# Patient Record
Sex: Female | Born: 1970 | Race: Black or African American | Hispanic: No | Marital: Single | Smoking: Never smoker
Health system: Southern US, Community
[De-identification: ages and names within clinical notes are randomized; demographics above are authoritative.]

## PROBLEM LIST (undated history)

## (undated) DIAGNOSIS — IMO0002 Reserved for concepts with insufficient information to code with codable children: Secondary | ICD-10-CM

## (undated) DIAGNOSIS — M329 Systemic lupus erythematosus, unspecified: Secondary | ICD-10-CM

## (undated) DIAGNOSIS — K219 Gastro-esophageal reflux disease without esophagitis: Secondary | ICD-10-CM

## (undated) DIAGNOSIS — R519 Headache, unspecified: Secondary | ICD-10-CM

## (undated) DIAGNOSIS — T7840XA Allergy, unspecified, initial encounter: Secondary | ICD-10-CM

## (undated) DIAGNOSIS — Z8619 Personal history of other infectious and parasitic diseases: Secondary | ICD-10-CM

## (undated) DIAGNOSIS — R51 Headache: Secondary | ICD-10-CM

## (undated) HISTORY — DX: Gastro-esophageal reflux disease without esophagitis: K21.9

## (undated) HISTORY — DX: Reserved for concepts with insufficient information to code with codable children: IMO0002

## (undated) HISTORY — DX: Headache: R51

## (undated) HISTORY — DX: Systemic lupus erythematosus, unspecified: M32.9

## (undated) HISTORY — DX: Personal history of other infectious and parasitic diseases: Z86.19

## (undated) HISTORY — DX: Headache, unspecified: R51.9

## (undated) HISTORY — DX: Allergy, unspecified, initial encounter: T78.40XA

---

## 1999-11-05 ENCOUNTER — Other Ambulatory Visit: Admission: RE | Admit: 1999-11-05 | Discharge: 1999-11-05 | Payer: Self-pay | Admitting: Obstetrics and Gynecology

## 2017-09-06 ENCOUNTER — Encounter: Payer: Self-pay | Admitting: Family Medicine

## 2017-09-06 ENCOUNTER — Ambulatory Visit: Payer: Self-pay | Admitting: Nurse Practitioner

## 2017-09-06 ENCOUNTER — Ambulatory Visit: Payer: BLUE CROSS/BLUE SHIELD | Admitting: Family Medicine

## 2017-09-06 DIAGNOSIS — Z7689 Persons encountering health services in other specified circumstances: Secondary | ICD-10-CM | POA: Diagnosis not present

## 2017-09-06 NOTE — Assessment & Plan Note (Signed)
>  25 minutes spent in face to face time with patient, >50% spent in counselling or coordination of care and reviewing medical history. Awaiting records. The patient indicates understanding of these issues and agrees with the plan.

## 2017-09-06 NOTE — Progress Notes (Signed)
Subjective:   Patient ID: Tanya [name], female    DOB: Aug 12, 1971, 47 y.o.   MRN: [id number]  Tanya [name] is a pleasant 47 y.o. year old female who presents to clinic today with New Patient (Initial Visit) (Patient is here today as a new patient. She is not currently fasting.  [facility]  [facility] was her last PCP and they kept changing providers.) and Lupus (Patient was told that she has Lupus when she was at [facility].  She wants to be sure that this is true.  She signed a release form for those records)  on 09/06/2017  HPI:  Here to establish care. No complaints today.  Was told she may possibly have lupus due to a persistent rash she had last year- had blood work done, was referred to rheum.  Per pt, was told no treatment indicated at this time.  No myalgias.  No family history of autoimmune disease. Current Outpatient Medications on File Prior to Visit  Medication Sig Dispense Refill  . Ascorbic Acid (VITAMIN C) 100 MG tablet Take 100 mg by mouth daily.    Marland Kitchen. BLACK COHOSH PO Take by mouth.    . cholecalciferol (VITAMIN D) 1000 units tablet Take by mouth.    . levonorgestrel (MIRENA) 20 MCG/24HR IUD 1 each by Intrauterine route once.    . Multiple Vitamin (MULTIVITAMIN) tablet Take 1 tablet by mouth daily.    . Probiotic Product (SOLUBLE FIBER/PROBIOTICS PO) Take by mouth.    . vitamin B-12 (CYANOCOBALAMIN) 1000 MCG tablet Take 1,000 mcg by mouth daily.     No current facility-administered medications on file prior to visit.     Allergies not on file  Past Medical History:  Diagnosis Date  . Allergy   . Frequent headaches   . GERD (gastroesophageal reflux disease)   . History of chicken pox   . Lupus       Family History  Problem Relation Age of Onset  . Early death Mother   . Cirrhosis Father   . Alcohol abuse Father   . Cancer Daughter     Social History   Socioeconomic History  . Marital status: Unknown   Spouse name: Not on file  . Number of children: Not on file  . Years of education: Not on file  . Highest education level: Not on file  Social Needs  . Financial resource strain: Not on file  . Food insecurity - worry: Not on file  . Food insecurity - inability: Not on file  . Transportation needs - medical: Not on file  . Transportation needs - non-medical: Not on file  Occupational History  . Not on file  Tobacco Use  . Smoking status: Never Smoker  . Smokeless tobacco: Never Used  Substance and Sexual Activity  . Alcohol use: No    Frequency: Never  . Drug use: No  . Sexual activity: Yes    Partners: Male  Other Topics Concern  . Not on file  Social History Narrative  . Not on file   The PMH, PSH, Social History, Family History, Medications, and allergies [facility], and have been updated if relevant.   Review of Systems  Cardiovascular: Negative.   Gastrointestinal: Negative.   Musculoskeletal: Negative.   Skin: Positive for rash.  All other systems reviewed and are negative.      Objective:    BP 106/76 (BP Location: Left Arm, Patient  Position: Sitting, Cuff Size: Normal)   Pulse 88   Temp 97.7 F (36.5 C) (Oral)   Ht 5\' 4"  (1.626 m)   Wt 164 lb (74.4 kg)   LMP 09/06/2016   SpO2 99%   BMI 28.15 kg/m    Physical Exam    General:  Well-developed,well-nourished,in no acute distress; alert,appropriate and cooperative throughout examination Head:  normocephalic and atraumatic.   Eyes:  vision grossly intact, PERRL Ears:  R ear normal and L ear normal externally, TMs clear bilaterally Nose:  no external deformity.   Mouth:  good dentition.   Neck:  No deformities, masses, or tenderness noted.  Lungs:  Normal respiratory effort, chest expands symmetrically. Lungs are clear to auscultation, no crackles or wheezes. Heart:  Normal rate and regular rhythm. S1 and S2 normal without gallop, murmur, click, rub or other extra sounds. Msk:  No  deformity or scoliosis noted of thoracic or lumbar spine.   Extremities:  No clubbing, cyanosis, edema, or deformity noted with normal full range of motion of all joints.   Neurologic:  alert & oriented X3 and gait normal.   Skin:  Intact without suspicious lesions or rashes Psych:  Cognition and judgment appear intact. Alert and cooperative with normal attention span and concentration. No apparent delusions, illusions, hallucinations      Assessment & Plan:   Establishing care with new doctor, encounter for No Follow-up on file.

## 2017-09-06 NOTE — Patient Instructions (Signed)
It was so great to meet you.  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

## 2017-09-20 ENCOUNTER — Encounter: Payer: Self-pay | Admitting: Family Medicine

## 2017-09-25 ENCOUNTER — Ambulatory Visit: Payer: BLUE CROSS/BLUE SHIELD | Admitting: Family Medicine

## 2017-09-25 ENCOUNTER — Ambulatory Visit (INDEPENDENT_AMBULATORY_CARE_PROVIDER_SITE_OTHER): Payer: BLUE CROSS/BLUE SHIELD

## 2017-09-25 ENCOUNTER — Encounter: Payer: Self-pay | Admitting: Family Medicine

## 2017-09-25 VITALS — BP 118/68 | HR 65 | Temp 98.0°F | Ht 64.0 in | Wt 165.0 lb

## 2017-09-25 DIAGNOSIS — M545 Low back pain, unspecified: Secondary | ICD-10-CM | POA: Insufficient documentation

## 2017-09-25 MED ORDER — PREDNISONE 5 MG PO TABS: ORAL_TABLET | ORAL | 0 refills | Status: DC

## 2017-09-25 NOTE — Assessment & Plan Note (Signed)
Pain seems to be related to SI joint with less movement on the left.  - xrays of SI joint and lumbar spine  - prednisone  - counseled on HEP  - f/u in 4-6 weeks, if no improvement consider Si joint injection

## 2017-09-25 NOTE — Patient Instructions (Signed)
We will call you with the results from today   Please try the exercises   Please follow up with me in 4-6 weeks if you don't have any improvement.

## 2017-09-25 NOTE — Progress Notes (Signed)
Tanya [name] - 47 y.o. female MRN [id number]  Date of birth: 1971/06/13  SUBJECTIVE:  Including CC & ROS.  Chief Complaint  Patient presents with  . Back and hip pain    Tanya GoldVivian A [name] is a 47 y.o. female that is presenting with low back pain. Pain has been increasing over the past two months. Located near her tailbone and radiates downward to the posterior aspect of greater trochanteric area, Pain is described as a constant burning. Worse when sitting or standing for long period. She has been using a heating pad, aleve and applying Aspercreme with no improvement. Pain is worse at night, unable to get in a comfortable position. Denies any prior symptoms, injury or surgery. Pain is getting worse.   Review of Systems  Constitutional: Negative for fever.  HENT: Negative for ear pain.   Respiratory: Negative for shortness of breath.   Cardiovascular: Negative for chest pain.  Gastrointestinal: Negative for abdominal pain.  Musculoskeletal: Positive for back pain. Negative for gait problem.  Skin: Negative for color change.  Neurological: Negative for weakness.  Hematological: Negative for adenopathy.  Psychiatric/Behavioral: Negative for agitation.    HISTORY: Past Medical, Surgical, Social, and Family History Reviewed & Updated per EMR.   Pertinent Historical Findings include:  Past Medical History:  Diagnosis Date  . Allergy   . Frequent headaches   . GERD (gastroesophageal reflux disease)   . History of chicken pox   . Lupus     Past Surgical History:  Procedure Laterality Date  . CESAREAN SECTION  2003    Not on File  Family History  Problem Relation Age of Onset  . Early death Mother   . Cirrhosis Father   . Alcohol abuse Father   . Cancer Daughter      Social History   Socioeconomic History  . Marital status: Single    Spouse name: Not on file  . Number of children: Not on file  . Years of education: Not on file  . Highest education level: Not on file   Social Needs  . Financial resource strain: Not on file  . Food insecurity - worry: Not on file  . Food insecurity - inability: Not on file  . Transportation needs - medical: Not on file  . Transportation needs - non-medical: Not on file  Occupational History  . Not on file  Tobacco Use  . Smoking status: Never Smoker  . Smokeless tobacco: Never Used  Substance and Sexual Activity  . Alcohol use: No    Frequency: Never  . Drug use: No  . Sexual activity: Yes    Partners: Male  Other Topics Concern  . Not on file  Social History Narrative  . Not on file     PHYSICAL EXAM:  VS: BP 118/68 (BP Location: Left Arm, Patient Position: Sitting, Cuff Size: Normal)   Pulse 65   Temp 98 F (36.7 C) (Oral)   Ht 5\' 4"  (1.626 m)   Wt 165 lb (74.8 kg)   SpO2 98%   BMI 28.32 kg/m  Physical Exam Gen: NAD, alert, cooperative with exam, well-appearing ENT: normal lips, normal nasal mucosa,  Eye: normal EOM, normal conjunctiva and lids CV:  no edema, +2 pedal pulses   Resp: no accessory muscle use, non-labored,  Skin: no rashes, no areas of induration  Neuro: normal tone, normal sensation to touch Psych:  normal insight, alert and oriented MSK:  Back:  TTP of the SI joint  No TTP  of the GT  Normal IR and ER of the hips  Limitation with FABER on left, more normal on right  Normal FADIR  Negative SLR b/l  Neurovascularly intact      ASSESSMENT & PLAN:   Acute bilateral low back pain without sciatica Pain seems to be related to SI joint with less movement on the left.  - xrays of SI joint and lumbar spine  - prednisone  - counseled on HEP  - f/u in 4-6 weeks, if no improvement consider Si joint injection

## 2017-09-26 ENCOUNTER — Ambulatory Visit: Payer: BLUE CROSS/BLUE SHIELD | Admitting: Family Medicine

## 2017-10-01 ENCOUNTER — Encounter: Payer: Self-pay | Admitting: Family Medicine

## 2017-10-02 MED ORDER — CYCLOBENZAPRINE HCL 10 MG PO TABS: 10.0000 mg | ORAL_TABLET | Freq: Three times a day (TID) | ORAL | 0 refills | Status: AC | PRN

## 2017-10-02 MED ORDER — IBUPROFEN 800 MG PO TABS: 800.0000 mg | ORAL_TABLET | Freq: Three times a day (TID) | ORAL | 1 refills | Status: AC | PRN

## 2018-03-12 ENCOUNTER — Encounter: Payer: Self-pay | Admitting: Family Medicine

## 2018-03-13 ENCOUNTER — Other Ambulatory Visit: Payer: Self-pay | Admitting: Family Medicine

## 2018-03-13 ENCOUNTER — Ambulatory Visit: Payer: BLUE CROSS/BLUE SHIELD | Admitting: Family Medicine

## 2018-03-13 ENCOUNTER — Other Ambulatory Visit (HOSPITAL_COMMUNITY)
Admission: RE | Admit: 2018-03-13 | Discharge: 2018-03-13 | Disposition: A | Discharge status: Home / Self Care | Payer: BLUE CROSS/BLUE SHIELD | Source: Ambulatory Visit | Attending: Family Medicine | Admitting: Family Medicine

## 2018-03-13 ENCOUNTER — Encounter: Payer: Self-pay | Admitting: Family Medicine

## 2018-03-13 VITALS — BP 112/70 | HR 87 | Temp 98.2°F | Ht 64.0 in | Wt 164.0 lb

## 2018-03-13 DIAGNOSIS — N898 Other specified noninflammatory disorders of vagina: Secondary | ICD-10-CM | POA: Insufficient documentation

## 2018-03-13 DIAGNOSIS — M545 Low back pain, unspecified: Secondary | ICD-10-CM

## 2018-03-13 NOTE — Progress Notes (Signed)
SUBJECTIVE:  47 y.o. female complains of copious, milky and thick vaginal discharge for 1 week(s). Denies abnormal vaginal bleeding or significant pelvic pain or fever. No UTI symptoms. Denies history of known exposure to STD.  Used OTC topical monistat for 3 days which helped some but still feels she has malodorous discharge.  Itching has improved some. No LMP recorded. (Menstrual status: IUD).  Current Outpatient Medications on File Prior to Visit  Medication Sig Dispense Refill  . Ascorbic Acid (VITAMIN C) 100 MG tablet Take 100 mg by mouth daily.    Marland Kitchen BLACK COHOSH PO Take by mouth.    . cholecalciferol (VITAMIN D) 1000 units tablet Take by mouth.    . cyclobenzaprine (FLEXERIL) 10 MG tablet Take 1 tablet (10 mg total) by mouth 3 (three) times daily as needed for muscle spasms. 30 tablet 0  . ibuprofen (ADVIL,MOTRIN) 800 MG tablet Take 1 tablet (800 mg total) by mouth every 8 (eight) hours as needed. 60 tablet 1  . levonorgestrel (MIRENA) 20 MCG/24HR IUD 1 each by Intrauterine route once.    . Multiple Vitamin (MULTIVITAMIN) tablet Take 1 tablet by mouth daily.    . predniSONE (DELTASONE) 5 MG tablet Take 6 pills for first day, 5 pills second day, 4 pills third day, 3 pills fourth day, 2 pills the fifth day, and 1 pill sixth day. 21 tablet 0  . Probiotic Product (SOLUBLE FIBER/PROBIOTICS PO) Take by mouth.    . vitamin B-12 (CYANOCOBALAMIN) 1000 MCG tablet Take 1,000 mcg by mouth daily.     No current facility-administered medications on file prior to visit.     Not on File  Past Medical History:  Diagnosis Date  . Allergy   . Frequent headaches   . GERD (gastroesophageal reflux disease)   . History of chicken pox   . Lupus [facility])     Past Surgical History:  Procedure Laterality Date  . CESAREAN SECTION  2003    Family History  Problem Relation Age of Onset  . Early death Mother   . Cirrhosis Father   . Alcohol abuse Father   . Cancer Daughter     Social History    Socioeconomic History  . Marital status: Single    Spouse name: Not on file  . Number of children: Not on file  . Years of education: Not on file  . Highest education level: Not on file  Occupational History  . Not on file  Social Needs  . Financial resource strain: Not on file  . Food insecurity:    Worry: Not on file    Inability: Not on file  . Transportation needs:    Medical: Not on file    Non-medical: Not on file  Tobacco Use  . Smoking status: Never Smoker  . Smokeless tobacco: Never Used  Substance and Sexual Activity  . Alcohol use: No    Frequency: Never  . Drug use: No  . Sexual activity: Yes    Partners: Male  Lifestyle  . Physical activity:    Days per week: Not on file    Minutes per session: Not on file  . Stress: Not on file  Relationships  . Social connections:    Talks on phone: Not on file    Gets together: Not on file    Attends religious service: Not on file    Active member of club or organization: Not on file    Attends meetings of clubs or organizations: Not on file  Relationship status: Not on file  . Intimate partner violence:    Fear of current or ex partner: Not on file    Emotionally abused: Not on file    Physically abused: Not on file    Forced sexual activity: Not on file  Other Topics Concern  . Not on file  Social History Narrative  . Not on file   The PMH, PSH, Social History, Family History, Medications, and allergies [facility], and have been updated if relevant.  OBJECTIVE:  BP 112/70   Pulse 87   Temp 98.2 F (36.8 C) (Oral)   Ht 5\' 4"  (1.626 m)   Wt 164 lb (74.4 kg)   SpO2 98%   BMI 28.15 kg/m   She appears well, afebrile. Abdomen: benign, soft, nontender, no masses. Pelvic Exam: normal external genitalia, vulva, vagina, cervix, uterus and adnexa with thick discharge in cerivcal os.   ASSESSMENT:  no pathogens identified causing these symptoms  PLAN:  GC and chlamydia DNA  probe sent to  lab. Treatment: abstain from coitus during course of treatment and wet prep sent. ROV prn if symptoms persist or worsen.

## 2018-03-13 NOTE — Patient Instructions (Addendum)
Great to see you. I will call you with your results from today and you can view them online.   Please make an appointment with Dr. Jordan LikesSchmitz on your way out.

## 2018-03-13 NOTE — Progress Notes (Signed)
Tanya [name] - 47 y.o. [name] MRN [id number]  Date of birth: 1971-06-04  SUBJECTIVE:  Including CC & ROS.  Chief Complaint  Patient presents with  . Back Pain    SI joint injection, left    Tanya [name] is a 47 y.o. [name] that is presenting with left hip and buttock pain and lower back pain.  She was seen in February and thought to have more of an SI joint problem at that time.  Since that time the pain is occurring in her left buttock as well as the lateral aspect of her left hip in the posterior aspect of her leg.  The pain is been ongoing and becoming more severe.  She has to sit sideways in the car because sitting for prolonged periods of time makes the pain worse.  She has tried medicines with minimal improvement.  She is try to do the exercises and stretching with minimal improvement.  She denies any saddle anesthesia or urinary incontinence.  She denies any specific inciting event..  Independent review of the sacrum from 2/4 shows a normal appearance.  Independent review of the lumbar x-ray from 2/4 shows mild degenerative changes in the lumbar spine.   Review of Systems  Constitutional: Negative for fever.  HENT: Negative for congestion.   Respiratory: Negative for cough.   Cardiovascular: Negative for chest pain.  Gastrointestinal: Negative for abdominal pain.  Musculoskeletal: Negative for gait problem.  Skin: Negative for color change.  Neurological: Negative for weakness.  Hematological: Negative for adenopathy.  Psychiatric/Behavioral: Negative for agitation.    HISTORY: Past Medical, Surgical, Social, and Family History Reviewed & Updated per EMR.   Pertinent Historical Findings include:  Past Medical History:  Diagnosis Date  . Allergy   . Frequent headaches   . GERD (gastroesophageal reflux disease)   . History of chicken pox   . Lupus [facility])     Past Surgical History:  Procedure Laterality Date  . CESAREAN SECTION  2003    Not on File  Family  History  Problem Relation Age of Onset  . Early death Mother   . Cirrhosis Father   . Alcohol abuse Father   . Cancer Daughter      Social History   Socioeconomic History  . Marital status: Single    Spouse name: Not on file  . Number of children: Not on file  . Years of education: Not on file  . Highest education level: Not on file  Occupational History  . Not on file  Social Needs  . Financial resource strain: Not on file  . Food insecurity:    Worry: Not on file    Inability: Not on file  . Transportation needs:    Medical: Not on file    Non-medical: Not on file  Tobacco Use  . Smoking status: Never Smoker  . Smokeless tobacco: Never Used  Substance and Sexual Activity  . Alcohol use: No    Frequency: Never  . Drug use: No  . Sexual activity: Yes    Partners: Male  Lifestyle  . Physical activity:    Days per week: Not on file    Minutes per session: Not on file  . Stress: Not on file  Relationships  . Social connections:    Talks on phone: Not on file    Gets together: Not on file    Attends religious service: Not on file    Active member of club or organization: Not on file  Attends meetings of clubs or organizations: Not on file    Relationship status: Not on file  . Intimate partner violence:    Fear of current or ex partner: Not on file    Emotionally abused: Not on file    Physically abused: Not on file    Forced sexual activity: Not on file  Other Topics Concern  . Not on file  Social History Narrative  . Not on file     PHYSICAL EXAM:  VS: BP 116/74   Ht 5\' 5"  (1.651 m)   Wt 166 lb (75.3 kg)   BMI 27.62 kg/m  Physical Exam Gen: NAD, alert, cooperative with exam, well-appearing ENT: normal lips, normal nasal mucosa,  Eye: normal EOM, normal conjunctiva and lids CV:  no edema, +2 pedal pulses   Resp: no accessory muscle use, non-labored,  Skin: no rashes, no areas of induration  Neuro: normal tone, normal sensation to touch Psych:   normal insight, alert and oriented MSK:  Back /left hip: Tenderness to palpation over the left greater trochanter Mild tenderness over the left SI joint. Normal strength resistance with hip flexion, knee flexion extension, plantarflexion and dorsiflexion. Negative straight leg raise bilaterally. Normal gait Neurovascularly intact   Aspiration/Injection Procedure Note [name] [birth year]  Procedure: Injection Indications: Left hip pain  Procedure Details Consent: Risks of procedure as well as the alternatives and risks of each were explained to the (patient/caregiver).  Consent for procedure obtained. Time Out: Verified patient identification, verified procedure, site/side was marked, verified correct patient position, special equipment/implants available, medications/allergies/relevent history reviewed, required imaging and test results available.  Performed.  The area was cleaned with iodine and alcohol swabs.    The left greater trochanter bursa was injected using 1 cc's of 40 mg Depomedrol and 4 cc's of 0.25% bupivacaine with a 22 3 1/2" needle.  Ultrasound was used. Images were obtained in Transverse views showing the injection.    A sterile dressing was applied.  Patient did tolerate procedure well.    ASSESSMENT & PLAN:   Left hip pain The pain today seems more related to the greater trochanter but could also be representative of sciatica.  Could also be referred from the SI joint but less likely given the tenderness over the area today. -Greater trochanter injection today -Initiate gabapentin -If no improvement would consider an SI joint injection at follow-up in an MRI of the lumbar spine to evaluate for nerve impingement. Consider PT.

## 2018-03-13 NOTE — Addendum Note (Signed)
Addended by: Harold BarbanBYRD, RONECIA E on: 03/13/2018 12:07 PM   Modules accepted: Orders

## 2018-03-14 ENCOUNTER — Encounter: Payer: Self-pay | Admitting: Family Medicine

## 2018-03-14 ENCOUNTER — Ambulatory Visit: Payer: BLUE CROSS/BLUE SHIELD | Admitting: Family Medicine

## 2018-03-14 ENCOUNTER — Ambulatory Visit (INDEPENDENT_AMBULATORY_CARE_PROVIDER_SITE_OTHER): Payer: BLUE CROSS/BLUE SHIELD

## 2018-03-14 VITALS — BP 116/74 | Ht 65.0 in | Wt 166.0 lb

## 2018-03-14 DIAGNOSIS — M25552 Pain in left hip: Secondary | ICD-10-CM

## 2018-03-14 LAB — CERVICOVAGINAL ANCILLARY ONLY
Bacterial vaginitis: NEGATIVE
CANDIDA VAGINITIS: NEGATIVE
CHLAMYDIA, DNA PROBE: NEGATIVE
NEISSERIA GONORRHEA: NEGATIVE
TRICH (WINDOWPATH): NEGATIVE

## 2018-03-14 MED ORDER — GABAPENTIN 100 MG PO CAPS: 100.0000 mg | ORAL_CAPSULE | Freq: Three times a day (TID) | ORAL | 3 refills | Status: AC

## 2018-03-14 NOTE — Patient Instructions (Signed)
Good to see you  Please try the gabapentin. This can make you sleepy. Please start with one at night and you can increase to two times or three times daily as your tolerate.  Please try physical therapy  Please see me back in 4 weeks

## 2018-03-14 NOTE — Assessment & Plan Note (Signed)
The pain today seems more related to the greater trochanter but could also be representative of sciatica.  Could also be referred from the SI joint but less likely given the tenderness over the area today. -Greater trochanter injection today -Initiate gabapentin -If no improvement would consider an SI joint injection at follow-up in an MRI of the lumbar spine to evaluate for nerve impingement. Consider PT.

## 2018-03-22 ENCOUNTER — Ambulatory Visit: Payer: BLUE CROSS/BLUE SHIELD | Attending: Family Medicine | Admitting: Physical Therapy

## 2018-03-22 ENCOUNTER — Encounter: Payer: Self-pay | Admitting: Physical Therapy

## 2018-03-22 ENCOUNTER — Other Ambulatory Visit: Payer: Self-pay

## 2018-03-22 DIAGNOSIS — R29898 Other symptoms and signs involving the musculoskeletal system: Secondary | ICD-10-CM | POA: Insufficient documentation

## 2018-03-22 DIAGNOSIS — M25552 Pain in left hip: Secondary | ICD-10-CM | POA: Insufficient documentation

## 2018-03-22 DIAGNOSIS — M25551 Pain in right hip: Secondary | ICD-10-CM | POA: Diagnosis not present

## 2018-03-22 DIAGNOSIS — R252 Cramp and spasm: Secondary | ICD-10-CM

## 2018-03-22 NOTE — Therapy (Addendum)
[facility] Outpatient Rehabilitation [facility] [address]  [address] [place], [place], [address] Phone: [phone]   Fax:  [phone]  Physical Therapy Evaluation  Patient Details  Name: Tanya [name] MRN: [id number] Date of Birth: [birth year] Referring Provider: Ruthe Mannan, MD   Encounter Date: 03/22/2018  PT End of Session - 03/22/18 1155    Visit Number  1    Number of Visits  10    Authorization Type  BCBS    Authorization - Number of Visits  30    PT Start Time  1017    PT Stop Time  1103    PT Time Calculation (min)  46 min    Activity Tolerance  Patient tolerated treatment well    Behavior During Therapy  [facility] for tasks assessed/performed       Past Medical History:  Diagnosis Date  . Allergy   . Frequent headaches   . GERD (gastroesophageal reflux disease)   . History of chicken pox   . Lupus [facility])     Past Surgical History:  Procedure Laterality Date  . CESAREAN SECTION  2003    There were no vitals filed for this visit.  Subjective Assessment - 03/22/18 1026    Subjective  Pt states that she is having symptoms and pain down into the L buttock and thigh area. Pt has difficulty with laying on her left side sleeping and is worried that laying on her R side all the time will eventually lead to problems with the R. Starting experiencing symptoms down into the L side in the past 3-4 weeks. Pt states that she was given cortisone shot last week with some relief but continues to experience pain/discomfort with sitting and driving for long periods of time.     Limitations  Sitting    How long can you sit comfortably?  45 minutes     Patient Stated Goals  Strengthen the muscles and help to alleviate pain    Currently in Pain?  No/denies    Pain Score  3  7 at worst    Pain Location  Hip    Pain Descriptors / Indicators  Burning;Discomfort    Pain Type  Acute pain    Pain Onset  1 to 4 weeks ago    Pain Frequency  Constant    Aggravating  Factors   Sitting, laying on the L side    Pain Relieving Factors  Medication Lidocaine patch, Cortisone shot     Effect of Pain on Daily Activities  Unable to sit for long periods of time when working and traveling between patients.          [facility] PT Assessment - 03/22/18 0001      Assessment   Medical Diagnosis  Acute bilateral low back pain without sciatica    Referring Provider  Ruthe Mannan, MD    Onset Date/Surgical Date  02/22/18 Approx date of most recent exacerbation    Next MD Visit  04/11/2018 With Clare Gandy, MD    Prior Therapy  No       Balance Screen   Has the patient fallen in the past 6 months  No      Home Environment   Living Environment  Private residence    Living Arrangements  Spouse/significant other    Type of Home  House    Home Access  Level entry      Prior Function   Level of Independence  Independent  Vocation  Full time employment    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 Pt placed feet in full ER during activity.       ROM / Strength   AROM / PROM / Strength  AROM;Strength      AROM   AROM Assessment Site  Lumbar;Hip    Lumbar Flexion  WNL    Lumbar Extension  WNL    Lumbar - Right Side Bend  WNl    Lumbar - Left Side Bend  WNL    Lumbar - Right Rotation  WNL    Lumbar - Left Rotation  WNL      Strength   Strength Assessment Site  Hip;Knee    Right/Left Hip  Right;Left    Right Hip Flexion  4-/5    Right Hip Extension  4+/5    Right Hip External Rotation   4+/5    Right Hip Internal Rotation  4+/5    Right Hip ABduction  5/5    Right Hip ADduction  4/5    Left Hip Flexion  4-/5     Left Hip Extension  4/5    Left Hip External Rotation  4/5    Left Hip Internal Rotation  4/5    Left Hip ABduction  4+/5    Left Hip ADduction  4-/5 + for pain with testing position    Right/Left Knee  Right;Left    Right Knee Flexion  5/5    Right Knee Extension  5/5    Left Knee Flexion  5/5    Left Knee Extension  5/5      Flexibility   Soft Tissue Assessment /Muscle Length  yes    Hamstrings  Mild tightness      Palpation   Spinal mobility  Good joint mobility in lumbar spine, mild restrictions in mobility in lower thoracic levels    Palpation comment  TTP along L greater trochanter. No TTP along low back or distal lateral thigh       Special Tests    Special Tests  Hip Special Tests;Sacrolliac Tests    Other special tests  Thigh Thrust; Positive; Left    Sacroiliac Tests   Pelvic Distraction    Hip Special Tests   Luisa Hart [facility]) Test;Hip Scouring      Pelvic Dictraction   Findings  Negative    Side   Left      Pelvic Compression   Findings  Negative    Side  Left      Sacral thrust    Findings  Negative    Side  Left      Gaenslen's test   Findings  Negative    Side   Left      Luisa Hart (FABER) Test   Findings  Negative    Side  Left    Comments  Pt noted some discomfort in anterior joint line of L hip      Hip Scouring   Findings  Negative    Side  Left  [facility] Adult PT Treatment/Exercise - 03/22/18 0001      Exercises   Exercises  Knee/Hip      Knee/Hip Exercises: Stretches   ITB Stretch  1 rep;30 seconds;Left    ITB Stretch Limitations  Supine with hip flexion and then adduction to adequate stretch ITB. Use of yoga strap to maintain position      Manual Therapy   Manual Therapy  Taping    Kinesiotex  Create Space      Kinesiotix   Create Space  Taping over the length of the L ITB and star pattern over the L greater trochanter. 30% stretch on ITB and 50% on star pattern             PT Education - 03/22/18  1516    Education Details  Pt educated on HEP, POC, and results of examination     Person(s) Educated  Patient    Methods  Explanation;Demonstration;Handout    Comprehension  Verbalized understanding;Returned demonstration         PT Long Term Goals - 03/22/18 1221      PT LONG TERM GOAL #1   Title  Pt will be independent with HEP    Target Date  05/03/18      PT LONG TERM GOAL #2   Title  Pt will report pain no greater than 2/10 at worst to impove tolerance to work related activities including driving long distances    Status  New    Target Date  05/03/18      PT LONG TERM GOAL #3   Title  Pt will have 5/5 global LE strength to improve LE stability and abilty to perform functional activities with decreased pain    Status  New    Target Date  05/03/18      PT LONG TERM GOAL #4   Title  Pt will report no TTP over the L greater trochanter     Status  New    Target Date  05/03/18      PT LONG TERM GOAL #5   Title  Pt will report overall 50% improvement in condition and ability to perform work-related activities without an increase in pain     Status  New    Target Date  05/03/18            Plan - 03/22/18 1156    Clinical Impression Statement  Pt is a 47 y/o F who presents to OP PT with low back pain and L lateral hip pain that is consistent with greater trochanteric bursitis. At this time she is more concerned about her hip pain. She presents with decreased L ER strength, TTP over L greater trochanter and decreased tolerance for sitting activities. These impairments are preventing pt from performing yard work activities and driving for long distances which is required of her at work. Her prognosis is positively impacted by her relatively active lifestyle and the nature of her work, but is negatively impacted by the chronicity of her symptoms. She will benefit from physical therapy to address her pain levels, improve her strength, and progress toward functional goals.      Clinical Presentation  Stable    Clinical Decision Making  Low    Rehab Potential  Good    PT Frequency  2x / week    PT Duration  3 weeks Then 1x week/3 weeks    PT Treatment/Interventions  ADLs/Self Care Home Management;Cryotherapy;Electrical Stimulation;Iontophoresis 4mg /ml Dexamethasone;Moist Heat;Ultrasound;Functional mobility training;Therapeutic activities;Therapeutic exercise;Neuromuscular  re-education;Patient/family education;Manual techniques;Dry needling;Taping;Vasopneumatic Device    PT Next Visit Plan  Assess reponse to taping; review exercises provided by MD    Consulted and Agree with Plan of Care  Patient       Patient will benefit from skilled therapeutic intervention in order to improve the following deficits and impairments:  Decreased activity tolerance, Decreased strength, Pain, Increased muscle spasms, Improper body mechanics, Impaired flexibility, Decreased mobility  Visit Diagnosis: Pain in left hip  Other symptoms and signs involving the musculoskeletal system  Cramp and spasm     Problem List Patient Active Problem List   Diagnosis Date Noted  . Left hip pain 03/14/2018  . Vaginal discharge 03/13/2018    Mikal PlaneJenna Amarien Carne, SPT 03/22/2018, 6:13 PM  [facility] [address]  [address] [place], [place], [address] Phone: [phone]   Fax:  [phone]  Name: Tanya [name] MRN: [id number] Date of Birth: 07/09/1971

## 2018-03-27 ENCOUNTER — Encounter: Payer: Self-pay | Admitting: Physical Therapy

## 2018-03-27 ENCOUNTER — Ambulatory Visit: Payer: BLUE CROSS/BLUE SHIELD | Admitting: Physical Therapy

## 2018-03-27 DIAGNOSIS — M25552 Pain in left hip: Secondary | ICD-10-CM

## 2018-03-27 DIAGNOSIS — R29898 Other symptoms and signs involving the musculoskeletal system: Secondary | ICD-10-CM

## 2018-03-27 DIAGNOSIS — R252 Cramp and spasm: Secondary | ICD-10-CM

## 2018-03-27 NOTE — Therapy (Addendum)
[facility] Outpatient Rehabilitation [facility] [address]  [address] [place], [place], [address] Phone: [phone]   Fax:  [phone]  Physical Therapy Treatment  Patient Details  Name: Tanya [name] MRN: [id number] Date of Birth: 27-Jun-1971 Referring Provider: Ruthe Mannan, MD   Encounter Date: 03/27/2018  PT End of Session - 03/27/18 1705    Visit Number  2    Number of Visits  10    Authorization Type  BCBS    Authorization - Number of Visits  30    PT Start Time  1701    PT Stop Time  1744    PT Time Calculation (min)  43 min    Activity Tolerance  Patient tolerated treatment well    Behavior During Therapy  [facility] for tasks assessed/performed       Past Medical History:  Diagnosis Date  . Allergy   . Frequent headaches   . GERD (gastroesophageal reflux disease)   . History of chicken pox   . Lupus [facility])     Past Surgical History:  Procedure Laterality Date  . CESAREAN SECTION  2003    There were no vitals filed for this visit.  Subjective Assessment - 03/27/18 1702    Subjective  Pt reports that she is well today and continues to have difficulty with sleeping on her L side.     How long can you sit comfortably?  45 minutes     Patient Stated Goals  Strengthen the muscles and help to alleviate pain    Currently in Pain?  No/denies    Pain Score  0-No pain                       [facility] Adult PT Treatment/Exercise - 03/27/18 0001      Knee/Hip Exercises: Standing   Forward Lunges  Left;10 reps    Forward Lunges Limitations  L LE anterior; With VC and demonstration for proper form with R hip neutrally aligned and knee at 90 degrees    Forward Step Up  10 reps;Left;Step Height: 8"    Step Down  Left;10 reps;Step Height: 8"    Step Down Limitations  L LE     Functional Squat  Limitations    Functional Squat Limitations  12 reps with good form, VC and TC to prevent anterior translation of the knes over the toes. 2 UE assist  at the counter    Other Standing Knee Exercises  Lateral walking with red TB around forefoot; 15 reps in each direction      Knee/Hip Exercises: Supine   Bridges  15 reps;Both;Limitations    Bridges Limitations  5 reps with no band; 10 reps with red TB around knees      Knee/Hip Exercises: Sidelying   Other Sidelying Knee/Hip Exercises  Side plank from knees; Cueing to prevent hip flexion; 10 reps       Manual Therapy   Manual Therapy  Myofascial release;Taping    Kinesiotex  Inhibit Muscle      Kinesiotix   Create Space  Taping over the length of the L ITB - 30% stretch     Inhibit Muscle   30% stretch - L Glutes and from TFL to iliac crest             PT Education - 03/27/18 1759    Education Details  Provided with updated HEP exercises    Methods  Explanation;Demonstration;Handout  Comprehension  Verbalized understanding;Returned demonstration;Verbal cues required          PT Long Term Goals - 03/27/18 1752      PT LONG TERM GOAL #1   Title  Pt will be independent with HEP    Status  On-going      PT LONG TERM GOAL #2   Title  Pt will report pain no greater than 2/10 at worst to impove tolerance to work related activities including driving long distances    Status  On-going      PT LONG TERM GOAL #3   Title  Pt will have 5/5 global LE strength to improve LE stability and abilty to perform functional activities with decreased pain    Status  On-going      PT LONG TERM GOAL #4   Title  Pt will report no TTP over the L greater trochanter     Status  On-going      PT LONG TERM GOAL #5   Title  Pt will report overall 50% improvement in condition and ability to perform work-related activities without an increase in pain     Status  On-going            Plan - 03/27/18 1752    Clinical Impression Statement  Tanya [name] tolerated treatment well today focusing on strengthening of hip musculature and manual therapy to address increased tension in L ERs. At this  time she continues to demonstrate decreased strength in LE musculature and decreased tolerance to L side lying which is impacting her sleep tolerance. At this time she will continue to benefit from physical therapy to address these deficits and progress toward her functional goals.     Rehab Potential  Good    PT Duration  --    PT Treatment/Interventions  ADLs/Self Care Home Management;Cryotherapy;Electrical Stimulation;Iontophoresis 4mg /ml Dexamethasone;Moist Heat;Ultrasound;Functional mobility training;Therapeutic activities;Therapeutic exercise;Neuromuscular re-education;Patient/family education;Manual techniques;Dry needling;Taping;Vasopneumatic Device    Consulted and Agree with Plan of Care  Patient       Patient will benefit from skilled therapeutic intervention in order to improve the following deficits and impairments:  Decreased activity tolerance, Decreased strength, Pain, Increased muscle spasms, Improper body mechanics, Impaired flexibility, Decreased mobility  Visit Diagnosis: Pain in left hip  Other symptoms and signs involving the musculoskeletal system  Cramp and spasm     Problem List Patient Active Problem List   Diagnosis Date Noted  . Left hip pain 03/14/2018  . Vaginal discharge 03/13/2018    Mikal PlaneJenna Israel Werts, SPT 03/27/2018, 6:51 PM  [facility] [address]  [address] [place], [place], [address] Phone: [phone]   Fax:  [phone]  Name: Tanya [name] MRN: [id number] Date of Birth: [birth year]

## 2018-03-29 ENCOUNTER — Ambulatory Visit: Payer: BLUE CROSS/BLUE SHIELD | Admitting: Physical Therapy

## 2018-03-29 DIAGNOSIS — M25552 Pain in left hip: Secondary | ICD-10-CM | POA: Diagnosis not present

## 2018-03-29 DIAGNOSIS — R29898 Other symptoms and signs involving the musculoskeletal system: Secondary | ICD-10-CM

## 2018-03-29 DIAGNOSIS — R252 Cramp and spasm: Secondary | ICD-10-CM

## 2018-03-29 NOTE — Therapy (Addendum)
[facility] Outpatient Rehabilitation [facility] [address]  [address] [place], [place], [address] Phone: [phone]   Fax:  [phone]  Physical Therapy Treatment  Patient Details  Name: Tanya [name] MRN: [id number] Date of Birth: [birth year] Referring Provider: Ruthe Mannan, MD   Encounter Date: 03/29/2018  PT End of Session - 03/29/18 1448    Visit Number  3    Number of Visits  10    Authorization - Number of Visits  30    PT Start Time  1448    PT Stop Time  1530    PT Time Calculation (min)  42 min       Past Medical History:  Diagnosis Date  . Allergy   . Frequent headaches   . GERD (gastroesophageal reflux disease)   . History of chicken pox   . Lupus [facility])     Past Surgical History:  Procedure Laterality Date  . CESAREAN SECTION  2003    There were no vitals filed for this visit.  Subjective Assessment - 03/29/18 1451    Subjective  Pt reports she was a little sore from last session, "like a really good workout".  She could tell a difference with tape applied to LE.  She took ibprofen prior to session (9am) in anticipation of pain with session.   Pt reports 30% less pain compared to initial eval.     Currently in Pain?  No/denies    Pain Score  0-No pain         [facility] PT Assessment - 03/29/18 0001      Assessment   Medical Diagnosis  Acute bilateral low back pain without sciatica    Referring Provider  Ruthe Mannan, MD    Onset Date/Surgical Date  02/22/18   Approx date of most recent exacerbation   Next MD Visit  04/11/2018   With Clare Gandy, MD      [facility] Adult PT Treatment/Exercise - 03/29/18 0001      Knee/Hip Exercises: Stretches   Passive Hamstring Stretch  Right;Left;2 reps;30 seconds    Quad Stretch  Right;Left;30 seconds;3 reps    Lobbyist Limitations  standing x 1; prone with strap x 2    ITB Stretch  Left;2 reps;30 seconds;Right;1 rep    Piriformis Stretch  Left;Right;2 reps;30 seconds      Knee/Hip  Exercises: Aerobic   Nustep  L5: 6 min (arms/legs)       Manual Therapy   Manual Therapy  Soft tissue mobilization;Taping    Soft tissue mobilization  IASTM with Edge tool to Lt lateral thigh, quad, TFL, lateral hamstring, and piriformis to decrease fascial restrictions and pain. STM to same area.  TPR to Lt piriformis and glute med (wiht active contract/ relax)      Kinesiotix   Inhibit Muscle   Regular Rock tape applied with 15% stretch to lateral quad, lateral hamstring, greater trochanter to piriformis.to decompress tissue and pain.         PT Long Term Goals - 03/27/18 1752      PT LONG TERM GOAL #1   Title  Pt will be independent with HEP    Status  On-going      PT LONG TERM GOAL #2   Title  Pt will report pain no greater than 2/10 at worst to impove tolerance to work related activities including driving long distances    Status  On-going      PT LONG TERM GOAL #3  Title  Pt will have 5/5 global LE strength to improve LE stability and abilty to perform functional activities with decreased pain    Status  On-going      PT LONG TERM GOAL #4   Title  Pt will report no TTP over the L greater trochanter     Status  On-going      PT LONG TERM GOAL #5   Title  Pt will report overall 50% improvement in condition and ability to perform work-related activities without an increase in pain     Status  On-going            Plan - 03/29/18 1646    Clinical Impression Statement  Fascial restrictions found in pt's Lt mid quad, TFL, lateral mid hamstring, piriformis and glute medius.  Pt responded well to stretches and manual therapy.  Pt reporting 30% reduction of symptoms since start of therapy.  Progressing towards goals.     Rehab Potential  Good    PT Frequency  2x / week    PT Duration  3 weeks   then 1x/wk for 3 wks   PT Treatment/Interventions  ADLs/Self Care Home Management;Cryotherapy;Electrical Stimulation;Iontophoresis 4mg /ml Dexamethasone;Moist  Heat;Ultrasound;Functional mobility training;Therapeutic activities;Therapeutic exercise;Neuromuscular re-education;Patient/family education;Manual techniques;Dry needling;Taping;Vasopneumatic Device    PT Next Visit Plan  manual therapy to Lt hip; Assess reponse to taping; review exercises provided by MD    Consulted and Agree with Plan of Care  Patient       Patient will benefit from skilled therapeutic intervention in order to improve the following deficits and impairments:  Decreased activity tolerance, Decreased strength, Pain, Increased muscle spasms, Improper body mechanics, Impaired flexibility, Decreased mobility  Visit Diagnosis: Pain in left hip  Other symptoms and signs involving the musculoskeletal system  Cramp and spasm     Problem List Patient Active Problem List   Diagnosis Date Noted  . Left hip pain 03/14/2018  . Vaginal discharge 03/13/2018   Mayer CamelJennifer Carlson-Long, PTA 03/29/18 4:55 PM  [facility] [address]  [address] [place], [place], [address] Phone: [phone]   Fax:  [phone]  Name: Tanya [name] MRN: [id number] Date of Birth: Nov 23, 1970

## 2018-04-03 ENCOUNTER — Ambulatory Visit: Payer: BLUE CROSS/BLUE SHIELD | Admitting: Physical Therapy

## 2018-04-03 ENCOUNTER — Encounter: Payer: Self-pay | Admitting: Physical Therapy

## 2018-04-03 DIAGNOSIS — M25552 Pain in left hip: Secondary | ICD-10-CM

## 2018-04-03 DIAGNOSIS — R252 Cramp and spasm: Secondary | ICD-10-CM

## 2018-04-03 DIAGNOSIS — R29898 Other symptoms and signs involving the musculoskeletal system: Secondary | ICD-10-CM

## 2018-04-03 NOTE — Therapy (Signed)
[facility] [place] [address]  [address] [place], [place], [address] Phone: [phone]   Fax:  [phone]  Physical Therapy Treatment  Patient Details  Name: Tanya GoldVivian A Soeder MRN: [id number] Date of Birth: 1971-04-23 Referring Provider: Ruthe Mannanalia Aron, MD   Encounter Date: 04/03/2018  PT End of Session - 04/03/18 1110    Visit Number  4    Number of Visits  10    Authorization Type  BCBS    Authorization - Number of Visits  30    PT Start Time  1102    PT Stop Time  1146    PT Time Calculation (min)  44 min    Activity Tolerance  Patient tolerated treatment well    Behavior During Therapy  [facility] for tasks assessed/performed       Past Medical History:  Diagnosis Date  . Allergy   . Frequent headaches   . GERD (gastroesophageal reflux disease)   . History of chicken pox   . Lupus [facility](HCC)     Past Surgical History:  Procedure Laterality Date  . CESAREAN SECTION  2003    There were no vitals filed for this visit.  Subjective Assessment - 04/03/18 1107    Subjective  Pt reports that she has not taken any medication to address the hip pain today, and is therefore in more pain than usual. Taping from last session still in tact    Limitations  Sitting    Patient Stated Goals  Strengthen the muscles and help to alleviate pain    Currently in Pain?  Yes    Pain Score  5     Pain Location  Hip    Pain Orientation  Right    Pain Descriptors / Indicators  Burning;Discomfort    Pain Type  Acute pain                       [facility] Adult PT Treatment/Exercise - 04/03/18 0001      Exercises   Exercises  Knee/Hip      Knee/Hip Exercises: Stretches   ITB Stretch  Right;Left;2 reps;30 seconds    ITB Stretch Limitations  Standing at counter for balance      Knee/Hip Exercises: Aerobic   Nustep  L5 x 6 (LE only)      Knee/Hip Exercises: Standing   Forward Lunges  Both;10 reps    Forward Lunges Limitations  With  leading LE on Bosu ball; 1 UE assit for balance. VC for adequate foot spacing and proper form during exercise.     Hip Abduction  10 reps;Both    Abduction Limitations  Green TB around ankles; lateral walking with emphasis on good eccentric control of trailing limb    Functional Squat  10 reps    Functional Squat Limitations  10 sumo squats with bilateral hip ER     Other Standing Knee Exercises  Monster walks 3 x 10 steps forward and backward    Other Standing Knee Exercises  Bilateral RDL; holding 3# weight in each UE; 10 reps; VC       Manual Therapy   Manual Therapy  Soft tissue mobilization    Soft tissue mobilization  Manual TPR to distal lateral quads; PT palpated multiple twitch response in musculature, as well as increased tissue extensbility after. Instructed pt on use of rolling pin at home to address trigger points.  PT Education - 04/03/18 1148    Education Details  Reviewed/corrected form with HEP exercises provided during previous visit     Person(s) Educated  Patient    Methods  Explanation;Demonstration    Comprehension  Verbalized understanding;Returned demonstration          PT Long Term Goals - 03/27/18 1752      PT LONG TERM GOAL #1   Title  Pt will be independent with HEP    Status  On-going      PT LONG TERM GOAL #2   Title  Pt will report pain no greater than 2/10 at worst to impove tolerance to work related activities including driving long distances    Status  On-going      PT LONG TERM GOAL #3   Title  Pt will have 5/5 global LE strength to improve LE stability and abilty to perform functional activities with decreased pain    Status  On-going      PT LONG TERM GOAL #4   Title  Pt will report no TTP over the L greater trochanter     Status  On-going      PT LONG TERM GOAL #5   Title  Pt will report overall 50% improvement in condition and ability to perform work-related activities without an increase in pain     Status   On-going            Plan - 04/03/18 1153    Clinical Impression Statement  Session today focusing on manual therapy to address trigger points in lateral quad musculature. PT elicited minor twitch responses from muscle with increased tissue extensibility. Pt did well with strengthening activities but required VC to correct form with squats and hip abduction/lateral band walking exercise. She will continue to benefit from PT to further address soft tissue restrictions, decrease pain levels and progress toward functional goals.      Rehab Potential  Good    PT Treatment/Interventions  ADLs/Self Care Home Management;Cryotherapy;Electrical Stimulation;Iontophoresis 4mg /ml Dexamethasone;Moist Heat;Ultrasound;Functional mobility training;Therapeutic activities;Therapeutic exercise;Neuromuscular re-education;Patient/family education;Manual techniques;Dry needling;Taping;Vasopneumatic Device    Consulted and Agree with Plan of Care  Patient       Patient will benefit from skilled therapeutic intervention in order to improve the following deficits and impairments:  Decreased activity tolerance, Decreased strength, Pain, Increased muscle spasms, Improper body mechanics, Impaired flexibility, Decreased mobility  Visit Diagnosis: Pain in left hip  Other symptoms and signs involving the musculoskeletal system  Cramp and spasm     Problem List Patient Active Problem List   Diagnosis Date Noted  . Left hip pain 03/14/2018  . Vaginal discharge 03/13/2018    Mikal PlaneJenna Montez Stryker, SPT  04/03/2018, 11:57 AM  [facility] [place] [address]  [address] [place], [place], [address] Phone: [phone]   Fax:  [phone]  Name: Tanya GoldVivian A Detter MRN: [id number] Date of Birth: 09/02/1970

## 2018-04-03 NOTE — Addendum Note (Signed)
Addended by: Marry GuanKREIS, Esmerelda Finnigan M on: 04/03/2018 11:28 AM   Modules accepted: Orders

## 2018-04-05 ENCOUNTER — Ambulatory Visit: Payer: BLUE CROSS/BLUE SHIELD

## 2018-04-05 DIAGNOSIS — M25552 Pain in left hip: Secondary | ICD-10-CM | POA: Diagnosis not present

## 2018-04-05 DIAGNOSIS — R29898 Other symptoms and signs involving the musculoskeletal system: Secondary | ICD-10-CM

## 2018-04-05 DIAGNOSIS — R252 Cramp and spasm: Secondary | ICD-10-CM

## 2018-04-05 NOTE — Therapy (Signed)
[facility] [place] [address]  [address] [place], [place], [address] Phone: [phone]   Fax:  [phone]  Physical Therapy Treatment  Patient Details  Name: Tanya [name] MRN: [id number] Date of Birth: 06-13-1971 Referring Provider: Ruthe Mannanalia Aron, MD   Encounter Date: 04/05/2018  PT End of Session - 04/05/18 1109    Visit Number  5    Number of Visits  10    Authorization Type  BCBS    Authorization - Number of Visits  30    PT Start Time  1101    PT Stop Time  1139    PT Time Calculation (min)  38 min    Activity Tolerance  Patient tolerated treatment well    Behavior During Therapy  [facility] for tasks assessed/performed       Past Medical History:  Diagnosis Date  . Allergy   . Frequent headaches   . GERD (gastroesophageal reflux disease)   . History of chicken pox   . Lupus [facility](HCC)     Past Surgical History:  Procedure Laterality Date  . CESAREAN SECTION  2003    There were no vitals filed for this visit.  Subjective Assessment - 04/05/18 1105    Subjective  Pt. doing well today with no new complaints.      Patient Stated Goals  Strengthen the muscles and help to alleviate pain    Currently in Pain?  Yes    Pain Score  3     Pain Location  Hip    Pain Orientation  Left;Lateral    Pain Descriptors / Indicators  Burning;Discomfort    Pain Type  Acute pain    Multiple Pain Sites  No                       [facility] Adult PT Treatment/Exercise - 04/05/18 1113      Lumbar Exercises: Supine   Isometric Hip Flexion  10 reps;5 seconds    Isometric Hip Flexion Limitations  B LE's      Knee/Hip Exercises: Stretches   Passive Hamstring Stretch  Right;Left;2 reps;30 seconds    Passive Hamstring Stretch Limitations  strap    ITB Stretch  Right;Left;2 reps;30 seconds    ITB Stretch Limitations  with strap     Piriformis Stretch  Left;Right;2 reps;30 seconds    Piriformis Stretch Limitations  KTOS       Knee/Hip Exercises: Aerobic   Nustep  L5 x 7 (LE only)      Knee/Hip Exercises: Standing   Functional Squat  15 reps;3 seconds    Other Standing Knee Exercises  Monster walks 2 x 20 ft with red TB at ankles       Knee/Hip Exercises: Prone   Hip Extension  Right;Left;10 reps;1 set;Strengthening    Other Prone Exercises  B hip extension with knee bent x 10 reps       Manual Therapy   Manual Therapy  Soft tissue mobilization    Manual therapy comments  supine, sidelying     Soft tissue mobilization  STM to L lateral mid HS/ITB in area of tenderness; L glute/piri in area of tenderness               PT Short Term Goals - 03/22/18 1221      PT SHORT TERM GOAL #1   Title  --    Status  --    Target Date  --  PT SHORT TERM GOAL #2   Title  --    Status  --    Target Date  --      PT SHORT TERM GOAL #3   Title  --    Status  --    Target Date  --      PT SHORT TERM GOAL #4   Title  --    Status  --    Target Date  --      PT SHORT TERM GOAL #5   Title  --    Status  --    Target Date  --        PT Long Term Goals - 03/27/18 1752      PT LONG TERM GOAL #1   Title  Pt will be independent with HEP    Status  On-going      PT LONG TERM GOAL #2   Title  Pt will report pain no greater than 2/10 at worst to impove tolerance to work related activities including driving long distances    Status  On-going      PT LONG TERM GOAL #3   Title  Pt will have 5/5 global LE strength to improve LE stability and abilty to perform functional activities with decreased pain    Status  On-going      PT LONG TERM GOAL #4   Title  Pt will report no TTP over the L greater trochanter     Status  On-going      PT LONG TERM GOAL #5   Title  Pt will report overall 50% improvement in condition and ability to perform work-related activities without an increase in pain     Status  On-going            Plan - 04/05/18 1118    Clinical Impression Statement  Pt. noting  improvement in L lateral thigh soreness following last visit.  Some remaining tenderness in mid lateral HS and L glute today thus addressed this with STM with good response.  Duration of visit focused toward hip flexor glute strengthening to target remaining strength deficits and improve functional activity tolerance.  Pt. noting ~ 50% improvement in overall pain levels since starting therapy now.  Will continue to progress toward goals.      PT Treatment/Interventions  ADLs/Self Care Home Management;Cryotherapy;Electrical Stimulation;Iontophoresis 4mg /ml Dexamethasone;Moist Heat;Ultrasound;Functional mobility training;Therapeutic activities;Therapeutic exercise;Neuromuscular re-education;Patient/family education;Manual techniques;Dry needling;Taping;Vasopneumatic Device       Patient will benefit from skilled therapeutic intervention in order to improve the following deficits and impairments:  Decreased activity tolerance, Decreased strength, Pain, Increased muscle spasms, Improper body mechanics, Impaired flexibility, Decreased mobility  Visit Diagnosis: Pain in left hip  Other symptoms and signs involving the musculoskeletal system  Cramp and spasm     Problem List Patient Active Problem List   Diagnosis Date Noted  . Left hip pain 03/14/2018  . Vaginal discharge 03/13/2018    Kermit BaloMicah Kesley Gaffey, PTA 04/05/18 11:55 AM   [facility] [place] [address]  [address] [place], [place], [address] Phone: [phone]   Fax:  [phone]  Name: Tanya [name] MRN: [id number] Date of Birth: Feb 12, 1971

## 2018-04-10 ENCOUNTER — Encounter: Payer: Self-pay | Admitting: Physical Therapy

## 2018-04-10 ENCOUNTER — Ambulatory Visit: Payer: BLUE CROSS/BLUE SHIELD | Admitting: Physical Therapy

## 2018-04-10 DIAGNOSIS — M25552 Pain in left hip: Secondary | ICD-10-CM | POA: Diagnosis not present

## 2018-04-10 DIAGNOSIS — R29898 Other symptoms and signs involving the musculoskeletal system: Secondary | ICD-10-CM

## 2018-04-10 DIAGNOSIS — R252 Cramp and spasm: Secondary | ICD-10-CM

## 2018-04-10 NOTE — Therapy (Signed)
[facility] [place] [address]  [place], [place], [address] Phone: [phone]   Fax:  [phone]  Physical Therapy Treatment  Patient Details  Name: Tanya [name] MRN: [id number] Date of Birth: 1970-10-02 Referring Provider: Arnette Norris, MD   Encounter Date: 04/10/2018  PT End of Session - 04/10/18 1105    Visit Number  6    Number of Visits  10    Date for PT Re-Evaluation  05/03/18    Authorization Type  BCBS    Authorization - Number of Visits  30    PT Start Time  1105    PT Stop Time  1155    PT Time Calculation (min)  50 min    Activity Tolerance  Patient tolerated treatment well    Behavior During Therapy  [facility] for tasks assessed/performed       Past Medical History:  Diagnosis Date  . Allergy   . Frequent headaches   . GERD (gastroesophageal reflux disease)   . History of chicken pox   . Lupus [facility])     Past Surgical History:  Procedure Laterality Date  . CESAREAN SECTION  2003    There were no vitals filed for this visit.  Subjective Assessment - 04/10/18 1107    Subjective  Pt noting ~50% improvement with L hip pain so far with PT.    Patient Stated Goals  Strengthen the muscles and help to alleviate pain    Currently in Pain?  Yes    Pain Score  4     Pain Location  Hip    Pain Orientation  Left;Lateral    Pain Descriptors / Indicators  Numbness    Pain Type  Acute pain    Pain Onset  More than a month ago    Pain Frequency  Intermittent    Aggravating Factors   periods of inactivity, laying on L side    Pain Relieving Factors  kinesiotaping/rock tape    Effect of Pain on Daily Activities  difficulty with sitting for long periods, unable to lay on L side         [facility] PT Assessment - 04/10/18 1105      Assessment   Medical Diagnosis  Acute bilateral low back pain without sciatica; L hip pain    Referring Provider  Arnette Norris, MD    Onset Date/Surgical Date  02/22/18   Approx  date of most recent exacerbation   Next MD Visit  04/11/2018   With Clearance Coots, MD     Strength   Right Hip Flexion  4+/5    Right Hip Extension  4+/5    Right Hip External Rotation   5/5    Right Hip Internal Rotation  5/5    Right Hip ABduction  5/5    Right Hip ADduction  4+/5    Left Hip Flexion  4+/5    Left Hip Extension  4+/5   pain with resistance   Left Hip External Rotation  4+/5    Left Hip Internal Rotation  4+/5    Left Hip ABduction  4+/5    Left Hip ADduction  4+/5    Right Knee Flexion  5/5    Right Knee Extension  5/5    Left Knee Flexion  5/5    Left Knee Extension  5/5                   [facility] Adult PT  Treatment/Exercise - 04/10/18 1105      Exercises   Exercises  Lumbar;Knee/Hip      Knee/Hip Exercises: Aerobic   Tread Mill  1.5 mph x 6 min      Manual Therapy   Manual Therapy  Soft tissue mobilization;Myofascial release    Soft tissue mobilization  STM to L glutes, piriformis and B lumbar paraspinals    Myofascial Release  manual TPR to L glute minimus & piriformis       Trigger Point Dry Needling - 04/10/18 1105    Consent Given?  Yes    Education Handout Provided  Yes    Muscles Treated Upper Body  Longissimus    Muscles Treated Lower Body  Gluteus minimus;Piriformis    Longissimus Response  Twitch response elicited;Palpable increased muscle length   L lumbar   Gluteus Minimus Response  Twitch response elicited;Palpable increased muscle length   Left   Piriformis Response  Twitch response elicited;Palpable increased muscle length   Left            PT Short Term Goals - 03/22/18 1221      PT SHORT TERM GOAL #1   Title  --    Status  --    Target Date  --      PT SHORT TERM GOAL #2   Title  --    Status  --    Target Date  --      PT SHORT TERM GOAL #3   Title  --    Status  --    Target Date  --      PT SHORT TERM GOAL #4   Title  --    Status  --    Target Date  --      PT SHORT TERM GOAL #5    Title  --    Status  --    Target Date  --        PT Long Term Goals - 04/10/18 1112      PT LONG TERM GOAL #1   Title  Pt will be independent with HEP    Status  Partially Met      PT LONG TERM GOAL #2   Title  Pt will report pain no greater than 2/10 at worst to impove tolerance to work related activities including driving long distances    Status  On-going      PT LONG TERM GOAL #3   Title  Pt will have 5/5 global LE strength to improve LE stability and abilty to perform functional activities with decreased pain    Status  Partially Met      PT LONG TERM GOAL #4   Title  Pt will report no TTP over the L greater trochanter     Status  On-going      PT LONG TERM GOAL #5   Title  Pt will report overall 50% improvement in condition and ability to perform work-related activities without an increase in pain     Status  Partially Met            Plan - 04/10/18 1112    Clinical Impression Statement  Tanya [name] reporting 50% improvement in pain levels since start of PT, but still notes limitations with laying on her L side and limited sitting tolerance due to L lateral hip pain. B proximal LE strength improving with good tolerance reported for exercises. Increased muscle tension/tightness and TTP persists in L glutes, piriformis, ITB,  lateral quads & HS as well as mild increased muscle tension in lumbar paraspinals which has been responding well to manual therapy including IASTM and DN. Pt will continue to benefit from skilled PT to restore normal muscle tension and flexibility as well as improve functional lumbopelvic strength for increased activity tolerance.    Rehab Potential  Good    PT Treatment/Interventions  ADLs/Self Care Home Management;Cryotherapy;Electrical Stimulation;Iontophoresis 4m/ml Dexamethasone;Moist Heat;Ultrasound;Functional mobility training;Therapeutic activities;Therapeutic exercise;Neuromuscular re-education;Patient/family education;Manual techniques;Dry  needling;Taping;Vasopneumatic Device    Consulted and Agree with Plan of Care  Patient       Patient will benefit from skilled therapeutic intervention in order to improve the following deficits and impairments:  Decreased activity tolerance, Decreased strength, Pain, Increased muscle spasms, Improper body mechanics, Impaired flexibility, Decreased mobility  Visit Diagnosis: Pain in left hip  Other symptoms and signs involving the musculoskeletal system  Cramp and spasm     Problem List Patient Active Problem List   Diagnosis Date Noted  . Left hip pain 03/14/2018  . Vaginal discharge 03/13/2018    JPercival Spanish PT, MPT 04/10/2018, 12:48 PM  [facility] [place] [address]Phone: [phone]  Fax:  [phone] Name: Tanya MAGNOMRN: [id number] of Birth: 829-Jan-1972

## 2018-04-10 NOTE — Patient Instructions (Signed)

## 2018-04-11 ENCOUNTER — Encounter: Payer: Self-pay | Admitting: Family Medicine

## 2018-04-11 ENCOUNTER — Ambulatory Visit: Payer: BLUE CROSS/BLUE SHIELD | Admitting: Family Medicine

## 2018-04-11 DIAGNOSIS — M25552 Pain in left hip: Secondary | ICD-10-CM

## 2018-04-11 NOTE — Patient Instructions (Signed)
Nice to see you  Please continue the exercises  You can try heat over the area  Please finish up the physical therapy. If you have pain a month after finishing physical therapy then we can consider another injection.

## 2018-04-11 NOTE — Progress Notes (Signed)
Tanya [name] - 47 y.o. female MRN [id number]  Date of birth: [birth year]  SUBJECTIVE:  Including CC & ROS.  Chief Complaint  Patient presents with  . Follow-up    Tanya [name] is a 47 y.o. female that is here today for left hip pain follow up. She has been in physical therapy twice a week. She feels the hip pain has improved. She has been taking Motrin and Gabapentin for the pain.  She feels 50% improvement in her pain.  The pain is located to the lateral aspect of the greater trochanter.  She received a greater trochanteric bursa injection and had pain relief for 2 weeks.  She denies any radicular symptoms.  The pain is worse with sitting down.  She has no pain if she is up and moving around.   Review of Systems  Constitutional: Negative for fever.  HENT: Negative for congestion.   Respiratory: Negative for cough.   Cardiovascular: Negative for chest pain.  Gastrointestinal: Negative for abdominal pain.  Genitourinary: Negative for flank pain.  Musculoskeletal: Negative for gait problem.  Skin: Negative for color change.  Neurological: Negative for weakness.  Hematological: Negative for adenopathy.  Psychiatric/Behavioral: Negative for agitation.    HISTORY: Past Medical, Surgical, Social, and Family History Reviewed & Updated per EMR.   Pertinent Historical Findings include:  Past Medical History:  Diagnosis Date  . Allergy   . Frequent headaches   . GERD (gastroesophageal reflux disease)   . History of chicken pox   . Lupus [facility](HCC)     Past Surgical History:  Procedure Laterality Date  . CESAREAN SECTION  2003    No Known Allergies  Family History  Problem Relation Age of Onset  . Early death Mother   . Cirrhosis Father   . Alcohol abuse Father   . Cancer Daughter      Social History   Socioeconomic History  . Marital status: Single    Spouse name: Not on file  . Number of children: Not on file  . Years of education: Not on file  . Highest education  level: Not on file  Occupational History  . Not on file  Social Needs  . Financial resource strain: Not on file  . Food insecurity:    Worry: Not on file    Inability: Not on file  . Transportation needs:    Medical: Not on file    Non-medical: Not on file  Tobacco Use  . Smoking status: Never Smoker  . Smokeless tobacco: Never Used  Substance and Sexual Activity  . Alcohol use: No    Frequency: Never  . Drug use: No  . Sexual activity: Yes    Partners: Male  Lifestyle  . Physical activity:    Days per week: Not on file    Minutes per session: Not on file  . Stress: Not on file  Relationships  . Social connections:    Talks on phone: Not on file    Gets together: Not on file    Attends religious service: Not on file    Active member of club or organization: Not on file    Attends meetings of clubs or organizations: Not on file    Relationship status: Not on file  . Intimate partner violence:    Fear of current or ex partner: Not on file    Emotionally abused: Not on file    Physically abused: Not on file    Forced sexual activity: Not on  file  Other Topics Concern  . Not on file  Social History Narrative  . Not on file     PHYSICAL EXAM:  VS: BP 128/76 (BP Location: Left Arm, Patient Position: Sitting, Cuff Size: Normal)   Pulse 96   Ht 5\' 5"  (1.651 m)   Wt 159 lb (72.1 kg)   SpO2 98%   BMI 26.46 kg/m  Physical Exam Gen: NAD, alert, cooperative with exam, well-appearing ENT: normal lips, normal nasal mucosa,  Eye: normal EOM, normal conjunctiva and lids CV:  no edema, +2 pedal pulses   Resp: no accessory muscle use, non-labored,  Skin: no rashes, no areas of induration  Neuro: normal tone, normal sensation to touch Psych:  normal insight, alert and oriented MSK:  Left hip: Tenderness to palpation of the greater trochanter. Normal internal and external rotation of the hip. Normal strength resistance with hip flexion, knee flexion extension. No  tenderness to palpation of the sacroiliac joints or over the piriformis. Negative straight leg raise bilaterally. Normal gait. Neurovascular intact.      ASSESSMENT & PLAN:   Left hip pain Having improvement with PT and had two weeks of improvement with prior injection.  - continue PT  - can follow up 4 weeks after finishing PT and could consider injection if still in pain. Could consider further imaging.

## 2018-04-11 NOTE — Assessment & Plan Note (Addendum)
Having improvement with PT and had two weeks of improvement with prior injection.  - continue PT  - can follow up 4 weeks after finishing PT and could consider injection if still in pain. Could consider further imaging.

## 2018-04-12 ENCOUNTER — Ambulatory Visit: Payer: BLUE CROSS/BLUE SHIELD

## 2018-04-18 ENCOUNTER — Ambulatory Visit: Payer: BLUE CROSS/BLUE SHIELD

## 2018-04-18 DIAGNOSIS — R29898 Other symptoms and signs involving the musculoskeletal system: Secondary | ICD-10-CM

## 2018-04-18 DIAGNOSIS — M25552 Pain in left hip: Secondary | ICD-10-CM | POA: Diagnosis not present

## 2018-04-18 DIAGNOSIS — R252 Cramp and spasm: Secondary | ICD-10-CM

## 2018-04-18 NOTE — Patient Instructions (Signed)

## 2018-04-18 NOTE — Therapy (Signed)
[facility] [address]  [place], [place], [address] Phone: [phone]   Fax:  [phone]  Physical Therapy Treatment  Patient Details  Name: KASHAYLA UNGERER MRN: [id number] Date of Birth: 10-05-1970 Referring Provider: Arnette Norris, MD   Encounter Date: 04/18/2018  PT End of Session - 04/18/18 1111    Visit Number  7    Number of Visits  10    Date for PT Re-Evaluation  05/03/18    Authorization Type  BCBS    Authorization - Number of Visits  30    PT Start Time  1107    PT Stop Time  1142   Pt. noting she needed to leave early from therapy to make appointment    PT Time Calculation (min)  35 min    Activity Tolerance  Patient tolerated treatment well    Behavior During Therapy  [facility] for tasks assessed/performed       Past Medical History:  Diagnosis Date  . Allergy   . Frequent headaches   . GERD (gastroesophageal reflux disease)   . History of chicken pox   . Lupus [facility])     Past Surgical History:  Procedure Laterality Date  . CESAREAN SECTION  2003    There were no vitals filed for this visit.  Subjective Assessment - 04/18/18 1110    Subjective  Pt. noting some benefit from DN last visit for two days following last session.      Patient Stated Goals  Strengthen the muscles and help to alleviate pain    Currently in Pain?  Yes    Pain Score  4     Pain Location  Hip    Pain Orientation  Left;Lateral    Pain Descriptors / Indicators  --   "Pinchy"   Pain Type  Acute pain    Pain Frequency  Intermittent    Aggravating Factors   laying on L side     Multiple Pain Sites  No                       [facility] Adult PT Treatment/Exercise - 04/18/18 1126      Lumbar Exercises: Supine   Clam  3 seconds   x 12 reps    Clam Limitations  with red TB at knees       Knee/Hip Exercises: Stretches   Piriformis Stretch  Left;30 seconds    Piriformis Stretch Limitations  KTOS     Other  Knee/Hip Stretches  L glute foam roller x 1 min       Knee/Hip Exercises: Aerobic   Nustep  L5 x 7 (LE only)      Knee/Hip Exercises: Standing   Other Standing Knee Exercises  Side stepping with yellow TB at ankles 2 x 30 ft       Modalities   Modalities  Iontophoresis      Iontophoresis   Type of Iontophoresis  Dexamethasone    Location  L GT    patch #1/6   Dose  1.59m, 869mmin    Time  4-6hours       Manual Therapy   Manual Therapy  Soft tissue mobilization    Manual therapy comments  sidelying     Soft tissue mobilization  STM to L glute TFL - much improved tenderness              PT Education - 04/18/18 1151  Education Details  Iontophoresis educational handout including precautions, indications, and proper wear time with pt. verbalizing understnading     Person(s) Educated  Patient    Methods  Explanation;Verbal cues;Handout    Comprehension  Verbalized understanding;Verbal cues required;Need further instruction       PT Short Term Goals - 03/22/18 1221      PT SHORT TERM GOAL #1   Title  --    Status  --    Target Date  --      PT SHORT TERM GOAL #2   Title  --    Status  --    Target Date  --      PT SHORT TERM GOAL #3   Title  --    Status  --    Target Date  --      PT SHORT TERM GOAL #4   Title  --    Status  --    Target Date  --      PT SHORT TERM GOAL #5   Title  --    Status  --    Target Date  --        PT Long Term Goals - 04/10/18 1112      PT LONG TERM GOAL #1   Title  Pt will be independent with HEP    Status  Partially Met      PT LONG TERM GOAL #2   Title  Pt will report pain no greater than 2/10 at worst to impove tolerance to work related activities including driving long distances    Status  On-going      PT LONG TERM GOAL #3   Title  Pt will have 5/5 global LE strength to improve LE stability and abilty to perform functional activities with decreased pain    Status  Partially Met      PT LONG TERM GOAL #4    Title  Pt will report no TTP over the L greater trochanter     Status  On-going      PT LONG TERM GOAL #5   Title  Pt will report overall 50% improvement in condition and ability to perform work-related activities without an increase in pain     Status  Partially Met            Plan - 04/18/18 1111    Clinical Impression Statement  Memori reporting ongoing L lateral hip soreness when laying on L side at home since last visit.  Pt. ttp at L GT area today however noting reduced tenderness in L lateral hip musculature following DN last visit.  Initiated trial of iontophoresis today to L GT area as MD signed order and pt. verbalized understanding of handout including proper wear time and contraindications/precautions.  Pt. noting she needed to leave early from session today thus session time limited.      PT Treatment/Interventions  ADLs/Self Care Home Management;Cryotherapy;Electrical Stimulation;Iontophoresis 71m/ml Dexamethasone;Moist Heat;Ultrasound;Functional mobility training;Therapeutic activities;Therapeutic exercise;Neuromuscular re-education;Patient/family education;Manual techniques;Dry needling;Taping;Vasopneumatic Device    Consulted and Agree with Plan of Care  Patient       Patient will benefit from skilled therapeutic intervention in order to improve the following deficits and impairments:  Decreased activity tolerance, Decreased strength, Pain, Increased muscle spasms, Improper body mechanics, Impaired flexibility, Decreased mobility  Visit Diagnosis: Pain in left hip  Other symptoms and signs involving the musculoskeletal system  Cramp and spasm     Problem List Patient Active Problem List  Diagnosis Date Noted  . Left hip pain 03/14/2018  . Vaginal discharge 03/13/2018    Bess Harvest, PTA 04/18/18 11:54 AM    [facility] [address]  [place], [place], [address] Phone: [phone]   Fax:   [phone]  Name: AUDA FINFROCK MRN: [id number] Date of Birth: [birth year]

## 2018-04-24 ENCOUNTER — Encounter: Payer: Self-pay | Admitting: Physical Therapy

## 2018-04-24 ENCOUNTER — Ambulatory Visit: Payer: BLUE CROSS/BLUE SHIELD | Attending: Family Medicine | Admitting: Physical Therapy

## 2018-04-24 DIAGNOSIS — M25552 Pain in left hip: Secondary | ICD-10-CM | POA: Insufficient documentation

## 2018-04-24 DIAGNOSIS — R29898 Other symptoms and signs involving the musculoskeletal system: Secondary | ICD-10-CM | POA: Diagnosis present

## 2018-04-24 DIAGNOSIS — R252 Cramp and spasm: Secondary | ICD-10-CM

## 2018-04-24 NOTE — Therapy (Signed)
[facility] [address]  [place], [place], [address] Phone: [phone]   Fax:  [phone]  Physical Therapy Treatment  Patient Details  Name: Tanya [name] MRN: [id number] Date of Birth: 08/10/1971 Referring Provider: Arnette Norris, MD   Encounter Date: 04/24/2018  PT End of Session - 04/24/18 1011    Visit Number  8    Number of Visits  10    Date for PT Re-Evaluation  05/03/18    Authorization Type  BCBS    Authorization - Number of Visits  30    PT Start Time  1011    PT Stop Time  1102    PT Time Calculation (min)  51 min    Activity Tolerance  Patient tolerated treatment well    Behavior During Therapy  [facility] for tasks assessed/performed       Past Medical History:  Diagnosis Date  . Allergy   . Frequent headaches   . GERD (gastroesophageal reflux disease)   . History of chicken pox   . Lupus [facility])     Past Surgical History:  Procedure Laterality Date  . CESAREAN SECTION  2003    There were no vitals filed for this visit.  Subjective Assessment - 04/24/18 1013    Subjective  Pt noting benefit from ionto patch last visit. States pain overall improved with decreased intensity and frequency since start of PT.    Patient Stated Goals  Strengthen the muscles and help to alleviate pain    Currently in Pain?  Yes    Pain Score  4     Pain Location  Hip    Pain Orientation  Left;Lateral    Pain Descriptors / Indicators  Aching    Pain Type  Acute pain    Pain Frequency  Intermittent                       [facility] Adult PT Treatment/Exercise - 04/24/18 1012      Exercises   Exercises  Lumbar;Knee/Hip      Knee/Hip Exercises: Aerobic   Tread Mill  1.7 mph x 8 min      Knee/Hip Exercises: Standing   Side Lunges  Right;Left;10 reps;3 seconds    Side Lunges Limitations  TRX    Functional Squat  15 reps;3 seconds    Functional Squat Limitations  triple extension - UE support on  counter    Other Standing Knee Exercises  B side-stepping & fwd/back monster walks with green TB at ankles 2 x 19f      Knee/Hip Exercises: Supine   Bridges  Both;10 reps;Strengthening    Bridges Limitations  + green TB hip abduction isometric, cues fro 5 sec hold    Bridges with Clamshell  Both;10 reps;Strengthening   green TB     Knee/Hip Exercises: Sidelying   Clams  R/L clam with green TB x10    Other Sidelying Knee/Hip Exercises  B side plank from knee x10; Cueing to prevent hip flexion      Modalities   Modalities  Iontophoresis      Iontophoresis   Type of Iontophoresis  Dexamethasone    Location  L greater trochanter    Dose  80 mA-min, 1.0 mL    Time  4-6 hr patch (#2 of 6)             PT Education - 04/24/18 1100    Education Details  HEP review/update - emshasis on need for ongoing stretching to reduce muscle tightness and pressure over greater trochanteric bursa    Person(s) Educated  Patient    Methods  Explanation;Demonstration;Handout    Comprehension  Verbalized understanding;Returned demonstration       PT Short Term Goals - 03/22/18 1221      PT SHORT TERM GOAL #1   Title  --    Status  --    Target Date  --      PT SHORT TERM GOAL #2   Title  --    Status  --    Target Date  --      PT SHORT TERM GOAL #3   Title  --    Status  --    Target Date  --      PT SHORT TERM GOAL #4   Title  --    Status  --    Target Date  --      PT SHORT TERM GOAL #5   Title  --    Status  --    Target Date  --        PT Long Term Goals - 04/10/18 1112      PT LONG TERM GOAL #1   Title  Pt will be independent with HEP    Status  Partially Met      PT LONG TERM GOAL #2   Title  Pt will report pain no greater than 2/10 at worst to impove tolerance to work related activities including driving long distances    Status  On-going      PT LONG TERM GOAL #3   Title  Pt will have 5/5 global LE strength to improve LE stability and abilty to perform  functional activities with decreased pain    Status  Partially Met      PT LONG TERM GOAL #4   Title  Pt will report no TTP over the L greater trochanter     Status  On-going      PT LONG TERM GOAL #5   Title  Pt will report overall 50% improvement in condition and ability to perform work-related activities without an increase in pain     Status  Partially Met            Plan - 04/24/18 1016    Clinical Impression Statement  Zamiya noting good relief from ionto patch and overall reduction in pain frequency and intensity despite minimal change in pain rating since start of PT. She admits to limited compliance with HEP favoring sidestepping with red TB but not really spending much time on stretches - reinforced need for stretches to address increased muscle tension/tightness creating increased pressure over greater trochanter, as well as updated HEP including progressing resistance and complexity of existing exercises. Will plan to address readiness for transtion to HEP vs need for recert on next visit.    Rehab Potential  Good    PT Treatment/Interventions  ADLs/Self Care Home Management;Cryotherapy;Electrical Stimulation;Iontophoresis 38m/ml Dexamethasone;Moist Heat;Ultrasound;Functional mobility training;Therapeutic activities;Therapeutic exercise;Neuromuscular re-education;Patient/family education;Manual techniques;Dry needling;Taping;Vasopneumatic Device    Consulted and Agree with Plan of Care  Patient       Patient will benefit from skilled therapeutic intervention in order to improve the following deficits and impairments:  Decreased activity tolerance, Decreased strength, Pain, Increased muscle spasms, Improper body mechanics, Impaired flexibility, Decreased mobility  Visit Diagnosis: Pain in left hip  Other symptoms and signs involving the musculoskeletal system  Cramp  and spasm     Problem List Patient Active Problem List   Diagnosis Date Noted  . Left hip pain  03/14/2018  . Vaginal discharge 03/13/2018    Percival Spanish, PT, MPT 04/24/2018, 12:16 PM  [facility] [address]  [place], [place], [address] Phone: [phone]   Fax:  [phone]  Name: MONAE TOPPING MRN: [id number] Date of Birth: 02/22/1971

## 2018-05-01 ENCOUNTER — Ambulatory Visit: Payer: BLUE CROSS/BLUE SHIELD | Admitting: Physical Therapy

## 2018-05-03 ENCOUNTER — Encounter: Payer: Self-pay | Admitting: Physical Therapy

## 2018-05-03 ENCOUNTER — Ambulatory Visit: Payer: BLUE CROSS/BLUE SHIELD | Admitting: Physical Therapy

## 2018-05-03 DIAGNOSIS — R29898 Other symptoms and signs involving the musculoskeletal system: Secondary | ICD-10-CM

## 2018-05-03 DIAGNOSIS — M25552 Pain in left hip: Secondary | ICD-10-CM

## 2018-05-03 DIAGNOSIS — R252 Cramp and spasm: Secondary | ICD-10-CM

## 2018-05-03 NOTE — Therapy (Addendum)
[facility] [address]  [place], [place], [address] Phone: [phone]   Fax:  [phone]  Physical Therapy Treatment  Patient Details  Name: Tanya [name] MRN: [id number] Date of Birth: June 01, 1971 Referring Provider: Arnette Norris, MD   Encounter Date: 05/03/2018  PT End of Session - 05/03/18 1015    Visit Number  9    Number of Visits  10    Date for PT Re-Evaluation  05/03/18    Authorization Type  BCBS    Authorization - Number of Visits  30    PT Start Time  1015    PT Stop Time  1103    PT Time Calculation (min)  48 min    Activity Tolerance  Patient tolerated treatment well    Behavior During Therapy  [facility] for tasks assessed/performed       Past Medical History:  Diagnosis Date  . Allergy   . Frequent headaches   . GERD (gastroesophageal reflux disease)   . History of chicken pox   . Lupus [facility])     Past Surgical History:  Procedure Laterality Date  . CESAREAN SECTION  2003    There were no vitals filed for this visit.  Subjective Assessment - 05/03/18 1018    Subjective  Pt noting 75-80% improvement in pain since start of PT. Typically no pain at rest or with walking, but still limited tolerance for sitting and laying on L side.    Limitations  Sitting    How long can you sit comfortably?  15 minutes    Patient Stated Goals  Strengthen the muscles and help to alleviate pain    Currently in Pain?  Yes    Pain Score  0-No pain   up to 4/10 with sitting or laying on L side   Pain Location  Hip    Pain Orientation  Left;Lateral    Pain Descriptors / Indicators  Discomfort   "shallow"   Pain Type  Acute pain    Pain Frequency  Intermittent    Aggravating Factors   prolonged sittting or laying on L side    Pain Relieving Factors  exercises "when I do them like I'm supposed to"    Effect of Pain on Daily Activities  difficulty with sitting for long periods, unable to lay on L side          [facility] PT Assessment - 05/03/18 1015      Assessment   Medical Diagnosis  Acute bilateral low back pain without sciatica; L hip pain    Referring Provider  Arnette Norris, MD    Onset Date/Surgical Date  02/22/18   Approx date of most recent exacerbation   Next MD Visit  TBD on completion of PT episode      Observation/Other Assessments   Focus on Therapeutic Outcomes (FOTO)   Lumbar - 83% (17% limitation)      Strength   Right Hip Flexion  5/5    Right Hip Extension  4+/5    Right Hip External Rotation   5/5    Right Hip Internal Rotation  5/5    Right Hip ABduction  5/5    Right Hip ADduction  4+/5    Left Hip Flexion  4+/5    Left Hip Extension  4+/5    Left Hip External Rotation  5/5    Left Hip Internal Rotation  5/5    Left Hip ABduction  5/5  Left Hip ADduction  4+/5    Right Knee Flexion  5/5    Right Knee Extension  5/5    Left Knee Flexion  5/5    Left Knee Extension  5/5                   [facility] Adult PT Treatment/Exercise - 05/03/18 1015      Exercises   Exercises  Lumbar;Knee/Hip      Knee/Hip Exercises: Stretches   ITB Stretch  Left;30 seconds;2 reps   each   ITB Stretch Limitations  standing at wall & leaning over chair    Piriformis Stretch  Left;30 seconds;1 rep   each   Piriformis Stretch Limitations  long sitting & KTOS      Knee/Hip Exercises: Aerobic   Tread Mill  1.8 mph x 6 min      Knee/Hip Exercises: Standing   Other Standing Knee Exercises  B side-stepping & fwd/back monster walks with green TB at ankles 2 x 9f      Knee/Hip Exercises: Supine   Bridges with Clamshell  Both;15 reps;Strengthening   green TB     Knee/Hip Exercises: Sidelying   Clams  L clam with green TB in R side plank 2 x 5 reps             PT Education - 05/03/18 1100    Education Details  Final HEP review/update; theraband progression to green TB for HEP    Person(s) Educated  Patient    Methods  Explanation;Demonstration;Handout     Comprehension  Verbalized understanding;Returned demonstration          PT Long Term Goals - 05/03/18 1026      PT LONG TERM GOAL #1   Title  Pt will be independent with HEP    Status  Achieved      PT LONG TERM GOAL #2   Title  Pt will report pain no greater than 2/10 at worst to improve tolerance to work related activities including driving long distances    Status  On-going      PT LONG TERM GOAL #3   Title  Pt will have 5/5 global LE strength to improve LE stability and abilty to perform functional activities with decreased pain    Status  Partially Met      PT LONG TERM GOAL #4   Title  Pt will report no TTP over the L greater trochanter     Status  On-going      PT LONG TERM GOAL #5   Title  Pt will report overall 50% improvement in condition and ability to perform work-related activities without an increase in pain     Status  Achieved            Plan - 05/03/18 1035    Clinical Impression Statement  VTaniekareporting 75-80% improvement in pain since start of PT, with decreasing frequency and intensity of pain - now only experiencing pain up to 4/10 at worst with prolonged sitting or laying on L side, and remains ttp over greater trochanter. B proximal LE strength improved with all MMT 4+/5 to 5/5.  Pt feels like exercises have given her the greatest relief but admits to limited compliance with HEP, esp stretches.  Goals partially met at this time, however pt pleased with her current progress and feels ready to try transitioning to HEP, therefore wil place pt on hold for 30 days in the event that issues arise with  HEP or pain does not continue to improve.    Rehab Potential  Good    PT Treatment/Interventions  ADLs/Self Care Home Management;Cryotherapy;Electrical Stimulation;Iontophoresis 35m/ml Dexamethasone;Moist Heat;Ultrasound;Functional mobility training;Therapeutic activities;Therapeutic exercise;Neuromuscular re-education;Patient/family education;Manual  techniques;Dry needling;Taping;Vasopneumatic Device    PT Next Visit Plan  30 day hold    Consulted and Agree with Plan of Care  Patient       Patient will benefit from skilled therapeutic intervention in order to improve the following deficits and impairments:  Decreased activity tolerance, Decreased strength, Pain, Increased muscle spasms, Improper body mechanics, Impaired flexibility, Decreased mobility  Visit Diagnosis: Pain in left hip  Other symptoms and signs involving the musculoskeletal system  Cramp and spasm     Problem List Patient Active Problem List   Diagnosis Date Noted  . Left hip pain 03/14/2018  . Vaginal discharge 03/13/2018    JPercival Spanish PT, MPT 05/03/2018, 12:42 PM  [facility] [place] [address]Phone: [phone]  Fax:  [phone] Name: Tanya [name] PHYSICAL THERAPY DISCHARGE SUMMARY  Visits from Start of Care: 9  Current functional level related to goals / functional outcomes:   Refer to above clinical impression for status as of last visit on 05/03/18. Pt was placed on hold for 30 days and has not needed to return to PT, therefore will proceed with discharge from PT for this episode.   Remaining deficits:   As above.   Education / Equipment:   HEP  Plan: Patient agrees to discharge.  Patient goals were partially met. Patient is being discharged due to being pleased with the current functional level.  ?????     JPercival Spanish PT, MPT 06/26/18, 10:02 AM  [facility] [place] [address]Phone: [phone]  Fax:  [phone]

## 2019-03-15 IMAGING — DX DG SACRUM/COCCYX 2+V
3 series · 3 of 3 positions shown · non-contrast
Comparison: None.

CLINICAL DATA: Low back pain for 2 months.

EXAM:
SACRUM AND COCCYX - 2+ VIEW

[sacrum 20° caudo-cranial ap]
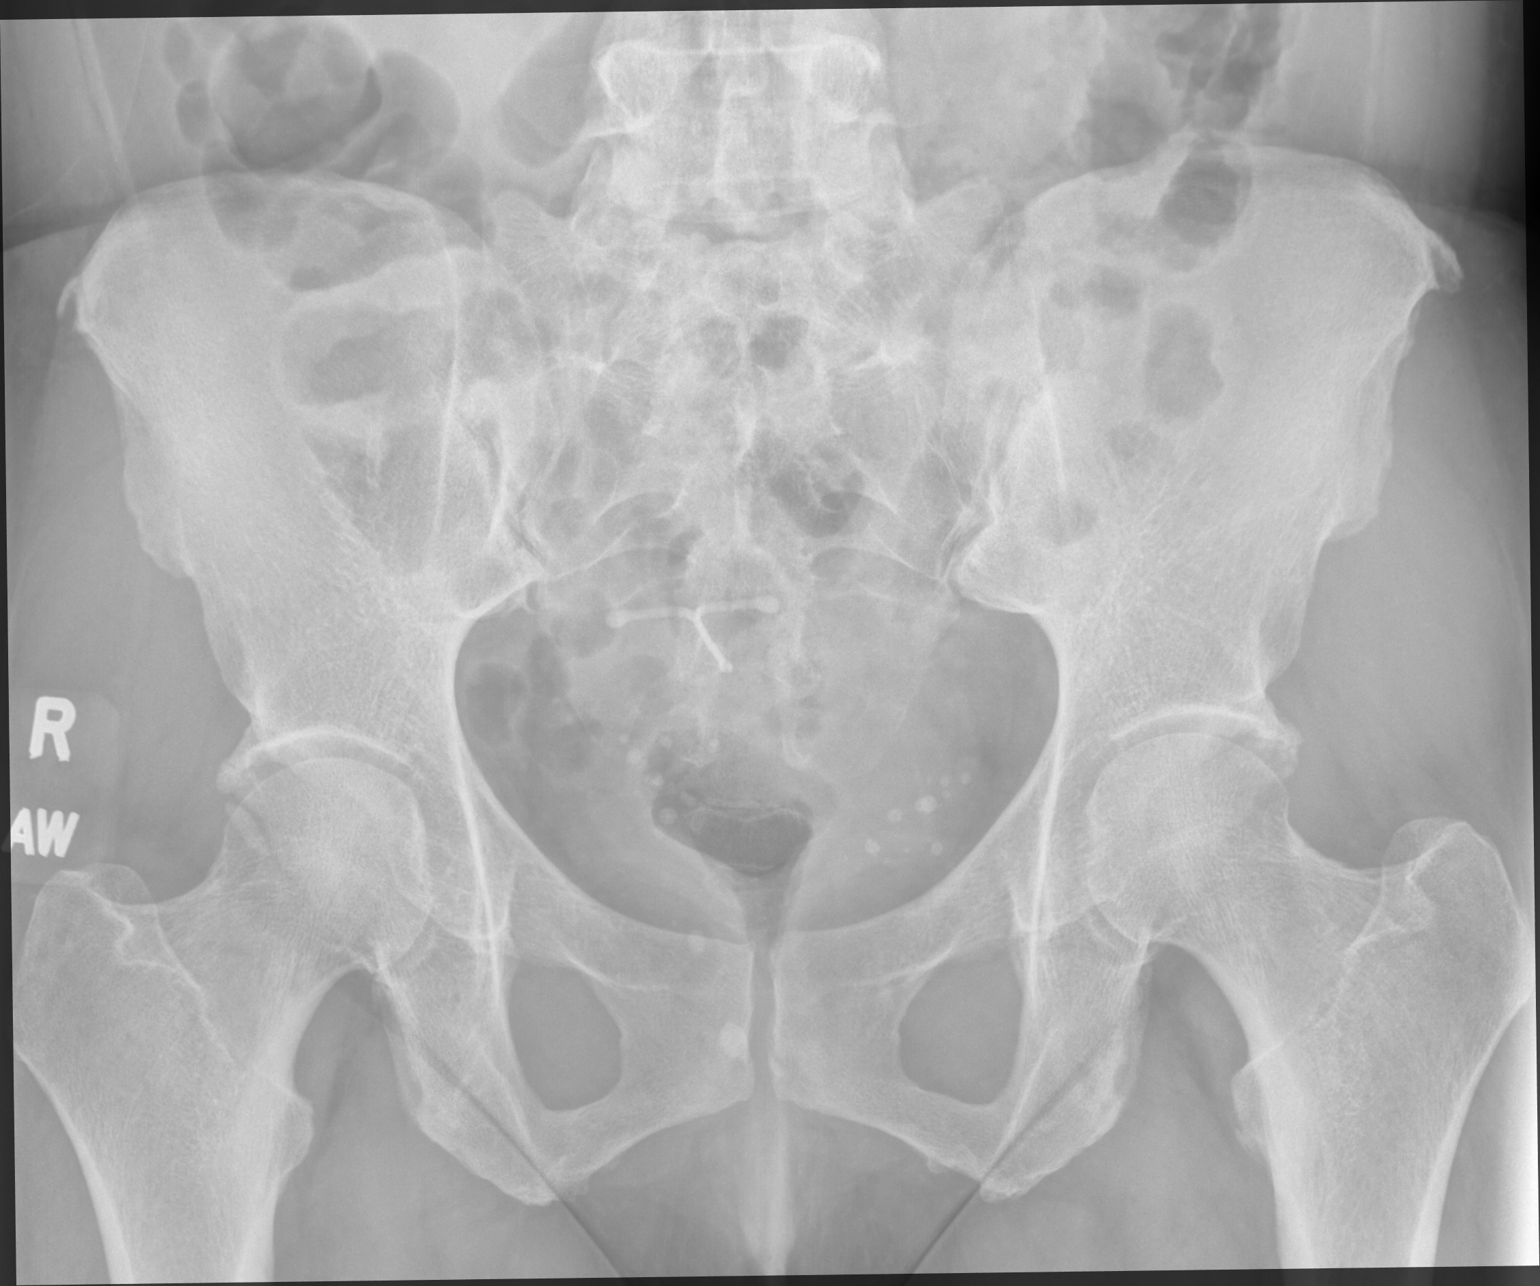

[coccyx 20° cranio-caudal ap]
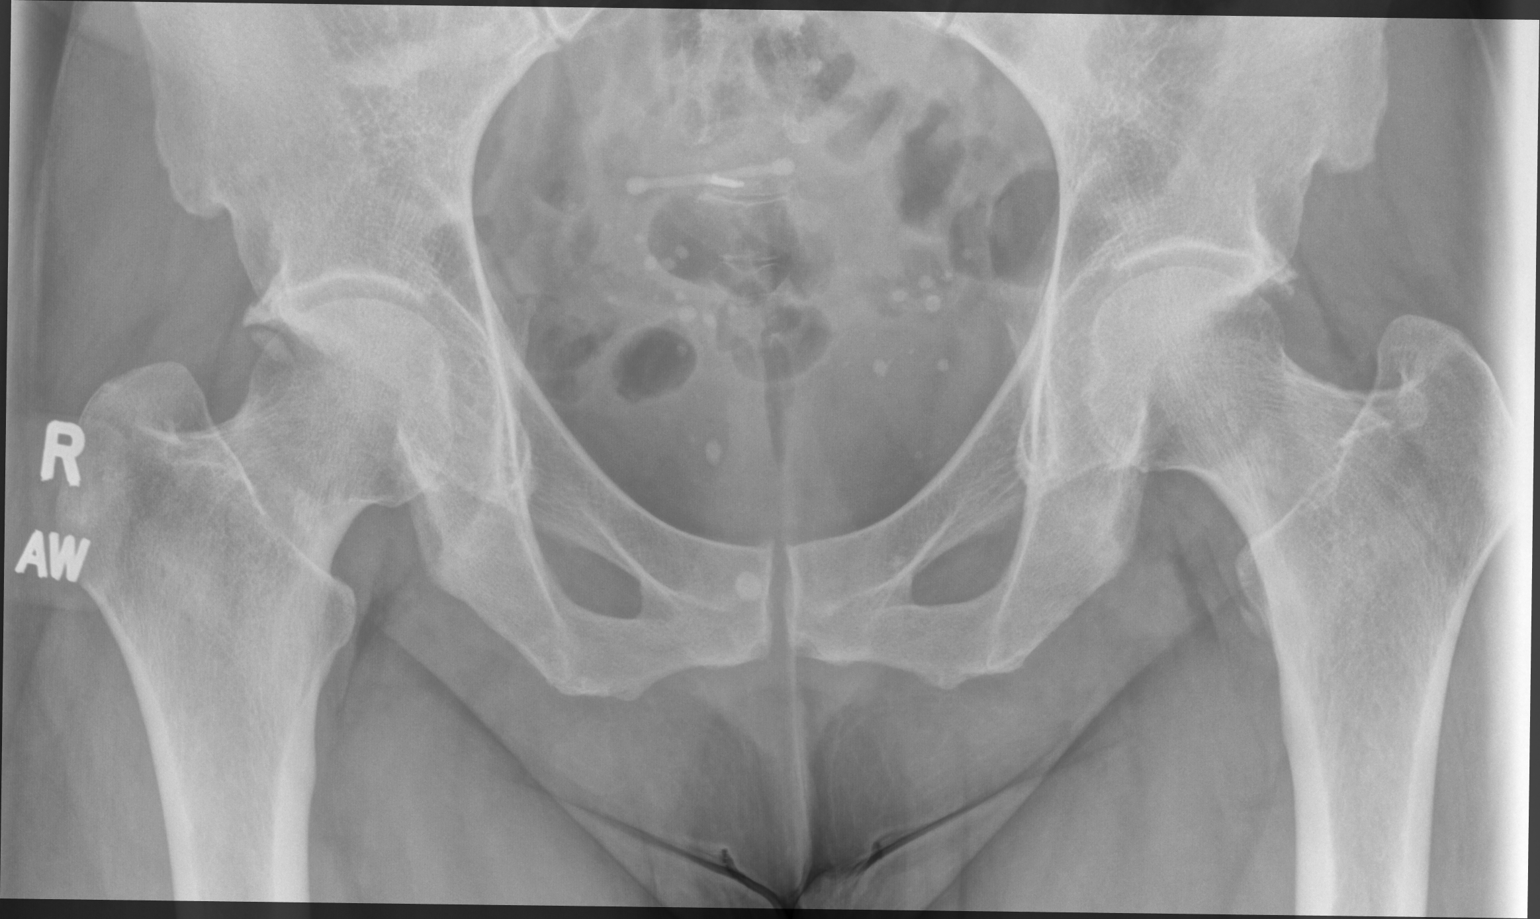

[coccyx lat]
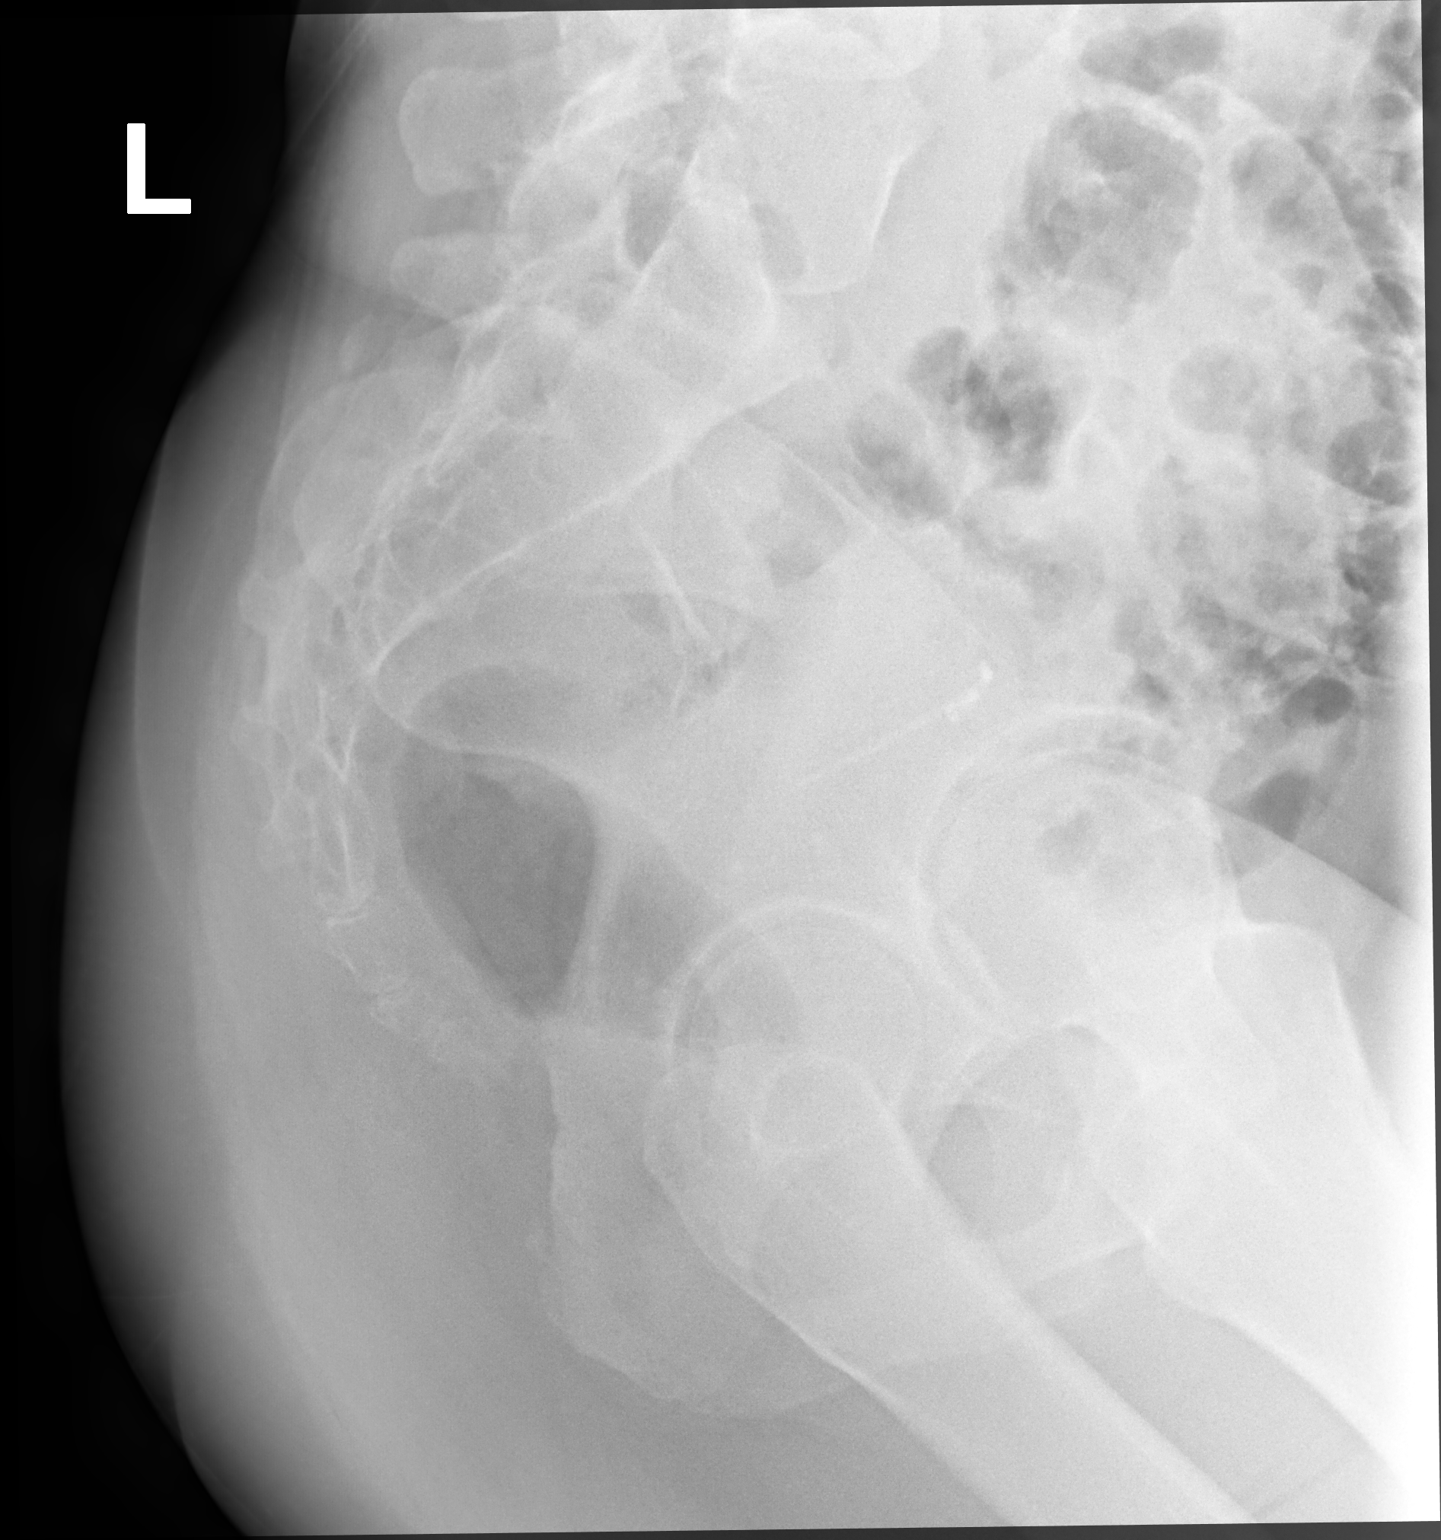

[3 of 3 positions shown; findings below may reference images not displayed]

FINDINGS: There is no evidence of fracture or other focal bone lesions.
IMPRESSION: Normal sacrum and coccyx.
